# Patient Record
Sex: Female | Born: 1965
Health system: Southern US, Community
[De-identification: ages and names within clinical notes are randomized; demographics above are authoritative.]

## PROBLEM LIST (undated history)

## (undated) DIAGNOSIS — S060X9A Concussion with loss of consciousness of unspecified duration, initial encounter: Secondary | ICD-10-CM

## (undated) DIAGNOSIS — R51 Headache: Secondary | ICD-10-CM

## (undated) DIAGNOSIS — F329 Major depressive disorder, single episode, unspecified: Secondary | ICD-10-CM

## (undated) DIAGNOSIS — F419 Anxiety disorder, unspecified: Secondary | ICD-10-CM

## (undated) DIAGNOSIS — S060XAA Concussion with loss of consciousness status unknown, initial encounter: Secondary | ICD-10-CM

## (undated) DIAGNOSIS — R519 Headache, unspecified: Secondary | ICD-10-CM

## (undated) DIAGNOSIS — A159 Respiratory tuberculosis unspecified: Secondary | ICD-10-CM

## (undated) DIAGNOSIS — F32A Depression, unspecified: Secondary | ICD-10-CM

## (undated) DIAGNOSIS — R011 Cardiac murmur, unspecified: Secondary | ICD-10-CM

## (undated) HISTORY — DX: Major depressive disorder, single episode, unspecified: F32.9

## (undated) HISTORY — DX: Concussion with loss of consciousness status unknown, initial encounter: S06.0XAA

## (undated) HISTORY — DX: Anxiety disorder, unspecified: F41.9

## (undated) HISTORY — DX: Depression, unspecified: F32.A

## (undated) HISTORY — DX: Concussion with loss of consciousness of unspecified duration, initial encounter: S06.0X9A

## (undated) HISTORY — PX: NO PAST SURGERIES: SHX2092

## (undated) HISTORY — DX: Headache: R51

## (undated) HISTORY — DX: Respiratory tuberculosis unspecified: A15.9

## (undated) HISTORY — DX: Headache, unspecified: R51.9

## (undated) HISTORY — DX: Cardiac murmur, unspecified: R01.1

---

## 2001-02-13 ENCOUNTER — Other Ambulatory Visit: Admission: RE | Admit: 2001-02-13 | Discharge: 2001-02-13 | Payer: Self-pay | Admitting: Obstetrics & Gynecology

## 2002-04-10 ENCOUNTER — Encounter: Payer: Self-pay | Admitting: Obstetrics & Gynecology

## 2002-04-10 ENCOUNTER — Encounter: Admission: RE | Admit: 2002-04-10 | Discharge: 2002-04-10 | Payer: Self-pay | Admitting: Obstetrics & Gynecology

## 2002-06-05 ENCOUNTER — Other Ambulatory Visit: Admission: RE | Admit: 2002-06-05 | Discharge: 2002-06-05 | Payer: Self-pay | Admitting: Obstetrics & Gynecology

## 2003-02-05 ENCOUNTER — Encounter (INDEPENDENT_AMBULATORY_CARE_PROVIDER_SITE_OTHER): Payer: Self-pay | Admitting: Interventional Cardiology

## 2003-02-05 ENCOUNTER — Ambulatory Visit (HOSPITAL_COMMUNITY): Admission: RE | Admit: 2003-02-05 | Discharge: 2003-02-05 | Payer: Self-pay | Admitting: Pulmonary Disease

## 2003-04-30 ENCOUNTER — Other Ambulatory Visit: Admission: RE | Admit: 2003-04-30 | Discharge: 2003-04-30 | Payer: Self-pay | Admitting: Obstetrics & Gynecology

## 2004-05-12 ENCOUNTER — Encounter: Admission: RE | Admit: 2004-05-12 | Discharge: 2004-05-12 | Payer: Self-pay | Admitting: Pulmonary Disease

## 2004-08-15 DIAGNOSIS — R011 Cardiac murmur, unspecified: Secondary | ICD-10-CM

## 2004-08-15 HISTORY — DX: Cardiac murmur, unspecified: R01.1

## 2004-12-01 ENCOUNTER — Encounter: Payer: Self-pay | Admitting: Cardiology

## 2004-12-01 ENCOUNTER — Ambulatory Visit (HOSPITAL_COMMUNITY): Admission: RE | Admit: 2004-12-01 | Discharge: 2004-12-01 | Payer: Self-pay | Admitting: Pulmonary Disease

## 2004-12-01 ENCOUNTER — Ambulatory Visit: Payer: Self-pay | Admitting: Cardiology

## 2005-08-10 ENCOUNTER — Encounter: Admission: RE | Admit: 2005-08-10 | Discharge: 2005-08-10 | Payer: Self-pay | Admitting: Pulmonary Disease

## 2006-08-16 ENCOUNTER — Encounter: Admission: RE | Admit: 2006-08-16 | Discharge: 2006-08-16 | Payer: Self-pay | Admitting: Obstetrics & Gynecology

## 2007-08-22 ENCOUNTER — Encounter: Admission: RE | Admit: 2007-08-22 | Discharge: 2007-08-22 | Payer: Self-pay | Admitting: Obstetrics & Gynecology

## 2008-08-27 ENCOUNTER — Encounter: Admission: RE | Admit: 2008-08-27 | Discharge: 2008-08-27 | Payer: Self-pay | Admitting: Obstetrics & Gynecology

## 2009-08-16 ENCOUNTER — Emergency Department (HOSPITAL_COMMUNITY): Admission: EM | Admit: 2009-08-16 | Discharge: 2009-08-16 | Payer: Self-pay | Admitting: Family Medicine

## 2009-09-02 ENCOUNTER — Encounter: Admission: RE | Admit: 2009-09-02 | Discharge: 2009-09-02 | Payer: Self-pay | Admitting: Obstetrics & Gynecology

## 2009-11-19 ENCOUNTER — Encounter: Admission: RE | Admit: 2009-11-19 | Discharge: 2009-11-19 | Payer: Self-pay | Admitting: Internal Medicine

## 2009-11-24 ENCOUNTER — Encounter: Admission: RE | Admit: 2009-11-24 | Discharge: 2009-11-24 | Payer: Self-pay | Admitting: Internal Medicine

## 2009-11-28 ENCOUNTER — Emergency Department (HOSPITAL_COMMUNITY): Admission: EM | Admit: 2009-11-28 | Discharge: 2009-11-28 | Payer: Self-pay | Admitting: Family Medicine

## 2009-12-04 ENCOUNTER — Encounter: Admission: RE | Admit: 2009-12-04 | Discharge: 2009-12-04 | Payer: Self-pay | Admitting: Neurological Surgery

## 2010-09-04 ENCOUNTER — Encounter: Payer: Self-pay | Admitting: Obstetrics & Gynecology

## 2010-09-15 ENCOUNTER — Encounter: Payer: Self-pay | Admitting: Obstetrics & Gynecology

## 2010-09-23 ENCOUNTER — Other Ambulatory Visit: Payer: Self-pay | Admitting: Obstetrics & Gynecology

## 2010-09-23 DIAGNOSIS — Z1231 Encounter for screening mammogram for malignant neoplasm of breast: Secondary | ICD-10-CM

## 2010-10-12 ENCOUNTER — Ambulatory Visit: Payer: Self-pay

## 2011-04-08 ENCOUNTER — Ambulatory Visit
Admission: RE | Admit: 2011-04-08 | Discharge: 2011-04-08 | Disposition: A | Discharge status: Home / Self Care | Payer: 59 | Source: Ambulatory Visit | Attending: Obstetrics & Gynecology | Admitting: Obstetrics & Gynecology

## 2011-04-08 DIAGNOSIS — Z1231 Encounter for screening mammogram for malignant neoplasm of breast: Secondary | ICD-10-CM

## 2011-05-06 ENCOUNTER — Inpatient Hospital Stay (INDEPENDENT_AMBULATORY_CARE_PROVIDER_SITE_OTHER)
Admission: RE | Admit: 2011-05-06 | Discharge: 2011-05-06 | Disposition: A | Discharge status: Home / Self Care | Payer: 59 | Source: Ambulatory Visit | Attending: Emergency Medicine | Admitting: Emergency Medicine

## 2011-05-06 DIAGNOSIS — M79609 Pain in unspecified limb: Secondary | ICD-10-CM

## 2013-09-10 ENCOUNTER — Emergency Department (HOSPITAL_COMMUNITY): Payer: 59

## 2013-09-10 ENCOUNTER — Emergency Department (HOSPITAL_COMMUNITY)
Admission: EM | Admit: 2013-09-10 | Discharge: 2013-09-10 | Disposition: A | Discharge status: Home / Self Care | Payer: 59 | Attending: Emergency Medicine | Admitting: Emergency Medicine

## 2013-09-10 ENCOUNTER — Encounter (HOSPITAL_COMMUNITY): Payer: Self-pay | Admitting: Emergency Medicine

## 2013-09-10 DIAGNOSIS — S3981XA Other specified injuries of abdomen, initial encounter: Secondary | ICD-10-CM | POA: Diagnosis present

## 2013-09-10 DIAGNOSIS — S301XXA Contusion of abdominal wall, initial encounter: Secondary | ICD-10-CM | POA: Insufficient documentation

## 2013-09-10 DIAGNOSIS — Y9389 Activity, other specified: Secondary | ICD-10-CM | POA: Diagnosis not present

## 2013-09-10 DIAGNOSIS — Z79899 Other long term (current) drug therapy: Secondary | ICD-10-CM | POA: Diagnosis not present

## 2013-09-10 DIAGNOSIS — Y92410 Unspecified street and highway as the place of occurrence of the external cause: Secondary | ICD-10-CM | POA: Diagnosis not present

## 2013-09-10 DIAGNOSIS — IMO0002 Reserved for concepts with insufficient information to code with codable children: Secondary | ICD-10-CM | POA: Diagnosis not present

## 2013-09-10 LAB — CBC WITH DIFFERENTIAL/PLATELET
Basophils Absolute: 0 10*3/uL (ref 0.0–0.1)
Basophils Relative: 0 % (ref 0–1)
Eosinophils Absolute: 0 10*3/uL (ref 0.0–0.7)
Eosinophils Relative: 1 % (ref 0–5)
HCT: 42 % (ref 36.0–46.0)
Hemoglobin: 14.4 g/dL (ref 12.0–15.0)
Lymphocytes Relative: 14 % (ref 12–46)
Lymphs Abs: 1.2 10*3/uL (ref 0.7–4.0)
MCH: 31.1 pg (ref 26.0–34.0)
MCHC: 34.3 g/dL (ref 30.0–36.0)
MCV: 90.7 fL (ref 78.0–100.0)
Monocytes Absolute: 0.6 10*3/uL (ref 0.1–1.0)
Monocytes Relative: 7 % (ref 3–12)
Neutro Abs: 6.5 10*3/uL (ref 1.7–7.7)
Neutrophils Relative %: 78 % — ABNORMAL HIGH (ref 43–77)
Platelets: 309 10*3/uL (ref 150–400)
RBC: 4.63 MIL/uL (ref 3.87–5.11)
RDW: 13.6 % (ref 11.5–15.5)
WBC: 8.4 10*3/uL (ref 4.0–10.5)

## 2013-09-10 LAB — BASIC METABOLIC PANEL
BUN: 9 mg/dL (ref 6–23)
CO2: 26 mEq/L (ref 19–32)
Calcium: 9.5 mg/dL (ref 8.4–10.5)
Chloride: 105 mEq/L (ref 96–112)
Creatinine, Ser: 0.86 mg/dL (ref 0.50–1.10)
GFR calc Af Amer: >90 mL/min (ref >90)
GFR calc non Af Amer: 78 mL/min — ABNORMAL LOW (ref >90)
Glucose, Bld: 103 mg/dL — ABNORMAL HIGH (ref 70–99)
Potassium: 3.7 mEq/L (ref 3.7–5.3)
Sodium: 143 mEq/L (ref 137–147)

## 2013-09-10 LAB — ABO/RH: ABO/RH(D): A POS

## 2013-09-10 LAB — TYPE AND SCREEN
ABO/RH(D): A POS
Antibody Screen: NEGATIVE

## 2013-09-10 LAB — CG4 I-STAT (LACTIC ACID): Lactic Acid, Venous: 1.39 mmol/L (ref 0.5–2.2)

## 2013-09-10 MED ORDER — SODIUM CHLORIDE 0.9 % IV BOLUS (SEPSIS)
1000.0000 mL | Freq: Once | INTRAVENOUS | Status: AC
  Administered 2013-09-10: 1000 mL via INTRAVENOUS

## 2013-09-10 MED ORDER — ONDANSETRON HCL 4 MG/2ML IJ SOLN
4.0000 mg | Freq: Once | INTRAMUSCULAR | Status: AC
  Administered 2013-09-10: 4 mg via INTRAVENOUS
  Filled 2013-09-10: qty 2

## 2013-09-10 MED ORDER — IBUPROFEN 600 MG PO TABS
600.0000 mg | ORAL_TABLET | Freq: Four times a day (QID) | ORAL | Status: DC | PRN
Start: 2013-09-10 — End: 2014-06-16

## 2013-09-10 MED ORDER — FENTANYL CITRATE 0.05 MG/ML IJ SOLN
50.0000 ug | INTRAMUSCULAR | Status: DC | PRN
  Administered 2013-09-10: 50 ug via INTRAVENOUS
  Filled 2013-09-10: qty 2

## 2013-09-10 MED ORDER — IBUPROFEN 200 MG PO TABS: 600.0000 mg | ORAL_TABLET | Freq: Once | ORAL | Status: DC

## 2013-09-10 MED ORDER — OXYCODONE-ACETAMINOPHEN 5-325 MG PO TABS
1.0000 | ORAL_TABLET | ORAL | Status: DC | PRN
Start: 2013-09-10 — End: 2014-06-05

## 2013-09-10 MED ORDER — IOHEXOL 300 MG/ML  SOLN
100.0000 mL | Freq: Once | INTRAMUSCULAR | Status: AC | PRN
  Administered 2013-09-10: 100 mL via INTRAVENOUS

## 2013-09-10 NOTE — ED Notes (Signed)
Bed: WA23 Expected date:  Expected time:  Means of arrival:  Comments: 
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
## 2013-09-10 NOTE — ED Notes (Signed)
Patient left prior to getting Advil 600mg 
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
## 2013-09-10 NOTE — ED Notes (Signed)
MD at bedside. 

## 2013-11-27 ENCOUNTER — Other Ambulatory Visit (HOSPITAL_COMMUNITY): Payer: Self-pay | Admitting: Specialist

## 2013-11-27 DIAGNOSIS — M545 Low back pain, unspecified: Secondary | ICD-10-CM

## 2013-11-27 DIAGNOSIS — M542 Cervicalgia: Secondary | ICD-10-CM

## 2013-12-10 ENCOUNTER — Ambulatory Visit (HOSPITAL_COMMUNITY)
Admission: RE | Admit: 2013-12-10 | Discharge: 2013-12-10 | Disposition: A | Discharge status: Home / Self Care | Payer: 59 | Source: Ambulatory Visit | Attending: Specialist | Admitting: Specialist

## 2013-12-10 DIAGNOSIS — M545 Low back pain, unspecified: Secondary | ICD-10-CM | POA: Insufficient documentation

## 2013-12-10 DIAGNOSIS — M542 Cervicalgia: Secondary | ICD-10-CM | POA: Insufficient documentation

## 2014-04-08 ENCOUNTER — Other Ambulatory Visit (HOSPITAL_COMMUNITY): Payer: Self-pay | Admitting: Specialist

## 2014-04-08 DIAGNOSIS — M25552 Pain in left hip: Secondary | ICD-10-CM

## 2014-04-08 DIAGNOSIS — M25562 Pain in left knee: Secondary | ICD-10-CM

## 2014-04-12 ENCOUNTER — Ambulatory Visit (HOSPITAL_BASED_OUTPATIENT_CLINIC_OR_DEPARTMENT_OTHER)
Admission: RE | Admit: 2014-04-12 | Discharge: 2014-04-12 | Disposition: A | Discharge status: Home / Self Care | Payer: 59 | Source: Ambulatory Visit | Attending: Specialist | Admitting: Specialist

## 2014-04-12 DIAGNOSIS — M25552 Pain in left hip: Secondary | ICD-10-CM

## 2014-04-12 DIAGNOSIS — R209 Unspecified disturbances of skin sensation: Secondary | ICD-10-CM | POA: Insufficient documentation

## 2014-04-12 DIAGNOSIS — M25559 Pain in unspecified hip: Secondary | ICD-10-CM | POA: Insufficient documentation

## 2014-04-15 ENCOUNTER — Ambulatory Visit (HOSPITAL_COMMUNITY): Payer: 59

## 2014-04-29 ENCOUNTER — Ambulatory Visit (HOSPITAL_COMMUNITY)
Admission: RE | Admit: 2014-04-29 | Discharge: 2014-04-29 | Disposition: A | Discharge status: Home / Self Care | Payer: 59 | Source: Ambulatory Visit | Attending: Anesthesiology | Admitting: Anesthesiology

## 2014-04-29 ENCOUNTER — Other Ambulatory Visit (HOSPITAL_COMMUNITY): Payer: Self-pay | Admitting: Anesthesiology

## 2014-04-29 DIAGNOSIS — M25512 Pain in left shoulder: Secondary | ICD-10-CM

## 2014-04-29 DIAGNOSIS — L539 Erythematous condition, unspecified: Secondary | ICD-10-CM | POA: Diagnosis not present

## 2014-04-29 DIAGNOSIS — M7989 Other specified soft tissue disorders: Secondary | ICD-10-CM | POA: Diagnosis not present

## 2014-04-29 DIAGNOSIS — M79609 Pain in unspecified limb: Secondary | ICD-10-CM | POA: Diagnosis not present

## 2014-04-29 DIAGNOSIS — M79605 Pain in left leg: Secondary | ICD-10-CM

## 2014-05-14 ENCOUNTER — Other Ambulatory Visit (HOSPITAL_BASED_OUTPATIENT_CLINIC_OR_DEPARTMENT_OTHER): Payer: Self-pay | Admitting: Obstetrics and Gynecology

## 2014-05-14 DIAGNOSIS — D259 Leiomyoma of uterus, unspecified: Secondary | ICD-10-CM

## 2014-05-27 ENCOUNTER — Other Ambulatory Visit (HOSPITAL_BASED_OUTPATIENT_CLINIC_OR_DEPARTMENT_OTHER): Payer: Self-pay | Admitting: Obstetrics and Gynecology

## 2014-05-27 DIAGNOSIS — D259 Leiomyoma of uterus, unspecified: Secondary | ICD-10-CM

## 2014-05-30 ENCOUNTER — Ambulatory Visit (HOSPITAL_COMMUNITY)
Admission: RE | Admit: 2014-05-30 | Discharge: 2014-05-30 | Disposition: A | Discharge status: Home / Self Care | Payer: 59 | Source: Ambulatory Visit | Attending: Obstetrics and Gynecology | Admitting: Obstetrics and Gynecology

## 2014-05-30 ENCOUNTER — Other Ambulatory Visit (HOSPITAL_COMMUNITY): Payer: 59

## 2014-05-30 DIAGNOSIS — D259 Leiomyoma of uterus, unspecified: Secondary | ICD-10-CM | POA: Insufficient documentation

## 2014-06-05 ENCOUNTER — Ambulatory Visit (INDEPENDENT_AMBULATORY_CARE_PROVIDER_SITE_OTHER): Payer: 59 | Admitting: Neurology

## 2014-06-05 ENCOUNTER — Encounter: Payer: Self-pay | Admitting: Neurology

## 2014-06-05 VITALS — BP 161/79 | HR 76 | Temp 98.6°F | Ht 67.5 in | Wt 151.0 lb

## 2014-06-05 DIAGNOSIS — M7989 Other specified soft tissue disorders: Secondary | ICD-10-CM | POA: Insufficient documentation

## 2014-06-05 DIAGNOSIS — R531 Weakness: Secondary | ICD-10-CM

## 2014-06-05 DIAGNOSIS — F0781 Postconcussional syndrome: Secondary | ICD-10-CM | POA: Insufficient documentation

## 2014-06-05 DIAGNOSIS — M5010 Cervical disc disorder with radiculopathy, unspecified cervical region: Secondary | ICD-10-CM | POA: Insufficient documentation

## 2014-06-05 DIAGNOSIS — IMO0002 Reserved for concepts with insufficient information to code with codable children: Secondary | ICD-10-CM | POA: Insufficient documentation

## 2014-06-05 DIAGNOSIS — G9050 Complex regional pain syndrome I, unspecified: Secondary | ICD-10-CM

## 2014-06-05 DIAGNOSIS — M6289 Other specified disorders of muscle: Secondary | ICD-10-CM

## 2014-06-05 DIAGNOSIS — R202 Paresthesia of skin: Secondary | ICD-10-CM

## 2014-06-05 MED ORDER — PREGABALIN 75 MG PO CAPS: 75.0000 mg | ORAL_CAPSULE | Freq: Two times a day (BID) | ORAL | Status: DC

## 2014-06-05 NOTE — Patient Instructions (Signed)
Overall you are doing fairly well but I do want to suggest a few things today:   Remember to drink plenty of fluid, eat healthy meals and do not skip any meals. Try to eat protein with a every meal and eat a healthy snack such as fruit or nuts in between meals. Try to keep a regular sleep-wake schedule and try to exercise daily, particularly in the form of walking, 20-30 minutes a day, if you can.   As far as your medications are concerned, I would like to suggest: Lyrica 75mg  twice daily for a week Lyrica 150mg  twice daily for a wekk Then call to discuss dosing  As far as diagnostic testing: CT of the abdomen with contrast, lab test  I would like to see you back in 3 months, sooner if we need to. Please call us with any interim questions, concerns, problems, updates or refill requests.   Please also call us for any test results so we can go over those with you on the phone.  My clinical assistant and will answer any of your questions and relay your messages to me and also relay most of my messages to you.   Our phone number is [phone]. We also have an after hours call service for urgent matters and there is a physician on-call for urgent questions. For any emergencies you know to call 911 or go to the nearest emergency room
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
## 2014-06-05 NOTE — Addendum Note (Signed)
Addended by: Sarina Ill B on: 06/05/2014 11:00 PM   Modules accepted: Orders

## 2014-06-06 LAB — BASIC METABOLIC PANEL
BUN / CREAT RATIO: 10 (ref 9–23)
BUN: 8 mg/dL (ref 6–24)
CO2: 27 mmol/L (ref 18–29)
CREATININE: 0.77 mg/dL (ref 0.57–1.00)
Calcium: 10.1 mg/dL (ref 8.7–10.2)
Chloride: 101 mmol/L (ref 96–108)
GFR calc non Af Amer: 90 mL/min/{1.73_m2} (ref >59)
GFR, EST AFRICAN AMERICAN: 104 mL/min/{1.73_m2} (ref >59)
Glucose: 87 mg/dL (ref 65–99)
Potassium: 4.5 mmol/L (ref 3.5–5.2)
Sodium: 137 mmol/L (ref 134–144)

## 2014-06-09 ENCOUNTER — Telehealth: Payer: Self-pay | Admitting: Neurology

## 2014-06-09 NOTE — Telephone Encounter (Signed)
Patient requesting an order for MRI and CT scan at [facility].  Patient is aware of MRI on 11/1 at [facility], due to transportation it would be easier for her to go to [facility].  Please call anytime and may leave detailed message on voicemail.

## 2014-06-10 NOTE — Telephone Encounter (Signed)
Thank you. Will relay to [facility].

## 2014-06-11 NOTE — Telephone Encounter (Signed)
Message copied by Milta Deiters on Wed Jun 11, 2014 11:49 AM ------      Message from: Amber Combs      Created: Fri Jun 06, 2014  2:32 PM       Please let patient know that her metabolic panel lab is normal. Thank you. ------

## 2014-06-11 NOTE — Telephone Encounter (Signed)
Called patient. Gave lab results

## 2014-06-12 ENCOUNTER — Encounter: Payer: Self-pay | Admitting: *Deleted

## 2014-06-15 ENCOUNTER — Other Ambulatory Visit: Payer: 59

## 2014-06-16 ENCOUNTER — Encounter (HOSPITAL_COMMUNITY): Payer: Self-pay | Admitting: *Deleted

## 2014-06-16 ENCOUNTER — Encounter (HOSPITAL_COMMUNITY): Payer: Self-pay

## 2014-06-16 ENCOUNTER — Emergency Department (HOSPITAL_COMMUNITY)
Admission: EM | Admit: 2014-06-16 | Discharge: 2014-06-17 | Disposition: A | Discharge status: Home / Self Care | Payer: 59 | Attending: Emergency Medicine | Admitting: Emergency Medicine

## 2014-06-16 ENCOUNTER — Ambulatory Visit (HOSPITAL_COMMUNITY)
Admission: RE | Admit: 2014-06-16 | Discharge: 2014-06-16 | Disposition: A | Discharge status: Home / Self Care | Payer: 59 | Source: Ambulatory Visit | Attending: Neurology | Admitting: Neurology

## 2014-06-16 ENCOUNTER — Telehealth: Payer: Self-pay | Admitting: Neurology

## 2014-06-16 DIAGNOSIS — D259 Leiomyoma of uterus, unspecified: Secondary | ICD-10-CM | POA: Insufficient documentation

## 2014-06-16 DIAGNOSIS — R200 Anesthesia of skin: Secondary | ICD-10-CM | POA: Diagnosis not present

## 2014-06-16 DIAGNOSIS — R51 Headache: Secondary | ICD-10-CM | POA: Diagnosis present

## 2014-06-16 DIAGNOSIS — Z8611 Personal history of tuberculosis: Secondary | ICD-10-CM | POA: Insufficient documentation

## 2014-06-16 DIAGNOSIS — H0520 Unspecified exophthalmos: Secondary | ICD-10-CM | POA: Diagnosis not present

## 2014-06-16 DIAGNOSIS — M7989 Other specified soft tissue disorders: Secondary | ICD-10-CM | POA: Diagnosis not present

## 2014-06-16 DIAGNOSIS — R202 Paresthesia of skin: Secondary | ICD-10-CM

## 2014-06-16 DIAGNOSIS — I82402 Acute embolism and thrombosis of unspecified deep veins of left lower extremity: Secondary | ICD-10-CM

## 2014-06-16 DIAGNOSIS — R531 Weakness: Secondary | ICD-10-CM | POA: Diagnosis not present

## 2014-06-16 DIAGNOSIS — Z79899 Other long term (current) drug therapy: Secondary | ICD-10-CM | POA: Insufficient documentation

## 2014-06-16 DIAGNOSIS — I82409 Acute embolism and thrombosis of unspecified deep veins of unspecified lower extremity: Secondary | ICD-10-CM | POA: Diagnosis not present

## 2014-06-16 DIAGNOSIS — Z87828 Personal history of other (healed) physical injury and trauma: Secondary | ICD-10-CM | POA: Diagnosis not present

## 2014-06-16 MED ORDER — RIVAROXABAN 15 MG PO TABS
ORAL_TABLET | ORAL | Status: AC
  Filled 2014-06-16: qty 1

## 2014-06-16 MED ORDER — RIVAROXABAN (XARELTO) VTE STARTER PACK (15 & 20 MG): ORAL_TABLET | ORAL | Status: DC

## 2014-06-16 MED ORDER — IOHEXOL 300 MG/ML  SOLN
100.0000 mL | Freq: Once | INTRAMUSCULAR | Status: AC | PRN
  Administered 2014-06-16: 40 mL via INTRAVENOUS

## 2014-06-16 MED ORDER — GADOBENATE DIMEGLUMINE 529 MG/ML IV SOLN
15.0000 mL | Freq: Once | INTRAVENOUS | Status: AC | PRN
  Administered 2014-06-16: 15 mL via INTRAVENOUS

## 2014-06-16 MED ORDER — RIVAROXABAN 15 MG PO TABS
15.0000 mg | ORAL_TABLET | Freq: Once | ORAL | Status: AC
  Administered 2014-06-16: 15 mg via ORAL
  Filled 2014-06-16: qty 1

## 2014-06-16 NOTE — Discharge Instructions (Signed)
Stop taking ibuprofen. Do not take any NSAID while you are taking Xarelto.   Deep Vein Thrombosis A deep vein thrombosis (DVT) is a blood clot that develops in the deep, larger veins of the leg, arm, or pelvis. These are more dangerous than clots that might form in veins near the surface of the body. A DVT can lead to serious and even life-threatening complications if the clot breaks off and travels in the bloodstream to the lungs.  A DVT can damage the valves in your leg veins so that instead of flowing upward, the blood pools in the lower leg. This is called post-thrombotic syndrome, and it can result in pain, swelling, discoloration, and sores on the leg. CAUSES Usually, several things contribute to the formation of blood clots. Contributing factors include:  The flow of blood slows down.  The inside of the vein is damaged in some way.  You have a condition that makes blood clot more easily. RISK FACTORS Some people are more likely than others to develop blood clots. Risk factors include:   Smoking.  Being overweight (obese).  Sitting or lying still for a long time. This includes long-distance travel, paralysis, or recovery from an illness or surgery. Other factors that increase risk are:   Older age, especially over 62 years of age.  Having a family history of blood clots or if you have already had a blot clot.  Having major or lengthy surgery. This is especially true for surgery on the hip, knee, or belly (abdomen). Hip surgery is particularly high risk.  Having a long, thin tube (catheter) placed inside a vein during a medical procedure.  Breaking a hip or leg.  Having cancer or cancer treatment.  Pregnancy and childbirth.  Hormone changes make the blood clot more easily during pregnancy.  The fetus puts pressure on the veins of the pelvis.  There is a risk of injury to veins during delivery or a caesarean delivery. The risk is highest just after  childbirth.  Medicines containing the female hormone estrogen. This includes birth control pills and hormone replacement therapy.  Other circulation or heart problems.  SIGNS AND SYMPTOMS When a clot forms, it can either partially or totally block the blood flow in that vein. Symptoms of a DVT can include:  Swelling of the leg or arm, especially if one side is much worse.  Warmth and redness of the leg or arm, especially if one side is much worse.  Pain in an arm or leg. If the clot is in the leg, symptoms may be more noticeable or worse when standing or walking. The symptoms of a DVT that has traveled to the lungs (pulmonary embolism, PE) usually start suddenly and include:  Shortness of breath.  Coughing.  Coughing up blood or blood-tinged mucus.  Chest pain. The chest pain is often worse with deep breaths.  Rapid heartbeat. Anyone with these symptoms should get emergency medical treatment right away. Do not wait to see if the symptoms will go away. Call your local emergency services (911 in the [place] if you have these symptoms. Do not drive yourself to the hospital. DIAGNOSIS If a DVT is suspected, your health care provider will take a full medical history and perform a physical exam. Tests that also may be required include:  Blood tests, including studies of the clotting properties of the blood.  Ultrasound to see if you have clots in your legs or lungs.  X-rays to show the flow of blood when dye  is injected into the veins (venogram).  Studies of your lungs if you have any chest symptoms. PREVENTION  Exercise the legs regularly. Take a brisk 30-minute walk every day.  Maintain a weight that is appropriate for your height.  Avoid sitting or lying in bed for long periods of time without moving your legs.  Women, particularly those over the age of 20 years, should consider the risks and benefits of taking estrogen medicines, including birth control pills.  Do not smoke,  especially if you take estrogen medicines.  Long-distance travel can increase your risk of DVT. You should exercise your legs by walking or pumping the muscles every hour.  Many of the risk factors above relate to situations that exist with hospitalization, either for illness, injury, or elective surgery. Prevention may include medical and nonmedical measures.  Your health care provider will assess you for the need for venous thromboembolism prevention when you are admitted to the hospital. If you are having surgery, your surgeon will assess you the day of or day after surgery. TREATMENT Once identified, a DVT can be treated. It can also be prevented in some circumstances. Once you have had a DVT, you may be at increased risk for a DVT in the future. The most common treatment for DVT is blood-thinning (anticoagulant) medicine, which reduces the blood's tendency to clot. Anticoagulants can stop new blood clots from forming and stop old clots from growing. They cannot dissolve existing clots. Your body does this by itself over time. Anticoagulants can be given by mouth, through an IV tube, or by injection. Your health care provider will determine the best program for you. Other medicines or treatments that may be used are:  Heparin or related medicines (low molecular weight heparin) are often the first treatment for a blood clot. They act quickly. However, they cannot be taken orally and must be given either in shot form or by IV tube.  Heparin can cause a fall in a component of blood that stops bleeding and forms blood clots (platelets). You will be monitored with blood tests to be sure this does not occur.  Warfarin is an anticoagulant that can be swallowed. It takes a few days to start working, so usually heparin or related medicines are used in combination. Once warfarin is working, heparin is usually stopped.  Factor Xa inhibitor medicines, such as rivaroxaban and apixaban, also reduce blood  clotting. These medicines are taken orally and can often be used without heparin or related medicines.  Less commonly, clot dissolving drugs (thrombolytics) are used to dissolve a DVT. They carry a high risk of bleeding, so they are used mainly in severe cases where your life or a part of your body is threatened.  Very rarely, a blood clot in the leg needs to be removed surgically.  If you are unable to take anticoagulants, your health care provider may arrange for you to have a filter placed in a main vein in your abdomen. This filter prevents clots from traveling to your lungs. HOME CARE INSTRUCTIONS  Take all medicines as directed by your health care provider.  Learn as much as you can about DVT.  Wear a medical alert bracelet or carry a medical alert card.  Ask your health care provider how soon you can go back to normal activities. It is important to stay active to prevent blood clots. If you are on anticoagulant medicine, avoid contact sports.  It is very important to exercise. This is especially important while traveling,  sitting, or standing for long periods of time. Exercise your legs by walking or by tightening and relaxing your leg muscles regularly. Take frequent walks.  You may need to wear compression stockings. These are tight elastic stockings that apply pressure to the lower legs. This pressure can help keep the blood in the legs from clotting. Taking Warfarin Warfarin is a daily medicine that is taken by mouth. Your health care provider will advise you on the length of treatment (usually 3-6 months, sometimes lifelong). If you take warfarin:  Understand how to take warfarin and foods that can affect how warfarin works in Veterinary surgeon.  Too much and too little warfarin are both dangerous. Too much warfarin increases the risk of bleeding. Too little warfarin continues to allow the risk for blood clots. Warfarin and Regular Blood Testing While taking warfarin, you will need to  have regular blood tests to measure your blood clotting time. These blood tests usually include both the prothrombin time (PT) and international normalized ratio (INR) tests. The PT and INR results allow your health care provider to adjust your dose of warfarin. It is very important that you have your PT and INR tested as often as directed by your health care provider.  Warfarin and Your Diet Avoid major changes in your diet, or notify your health care provider before changing your diet. Arrange a visit with a registered dietitian to answer your questions. Many foods, especially foods high in vitamin K, can interfere with warfarin and affect the PT and INR results. You should eat a consistent amount of foods high in vitamin K. Foods high in vitamin K include:   Spinach, kale, broccoli, cabbage, collard and turnip greens, Brussels sprouts, peas, cauliflower, seaweed, and parsley.  Beef and pork liver.  Green tea.  Soybean oil. Warfarin with Other Medicines Many medicines can interfere with warfarin and affect the PT and INR results. You must:  Tell your health care provider about any and all medicines, vitamins, and supplements you take, including aspirin and other over-the-counter anti-inflammatory medicines. Be especially cautious with aspirin and anti-inflammatory medicines. Ask your health care provider before taking these.  Do not take or discontinue any prescribed or over-the-counter medicine except on the advice of your health care provider or pharmacist. Warfarin Side Effects Warfarin can have side effects, such as easy bruising and difficulty stopping bleeding. Ask your health care provider or pharmacist about other side effects of warfarin. You will need to:  Hold pressure over cuts for longer than usual.  Notify your dentist and other health care providers that you are taking warfarin before you undergo any procedures where bleeding may occur. Warfarin with Alcohol and Tobacco    Drinking alcohol frequently can increase the effect of warfarin, leading to excess bleeding. It is best to avoid alcoholic drinks or to consume only very small amounts while taking warfarin. Notify your health care provider if you change your alcohol intake.   Do not use any tobacco products including cigarettes, chewing tobacco, or electronic cigarettes. If you smoke, quit. Ask your health care provider for help with quitting smoking. Alternative Medicines to Warfarin: Factor Xa Inhibitor Medicines  These blood-thinning medicines are taken by mouth, usually for several weeks or longer. It is important to take the medicine every single day at the same time each day.  There are no regular blood tests required when using these medicines.  There are fewer food and drug interactions than with warfarin.  The side effects of this class  of medicine are similar to those of warfarin, including excessive bruising or bleeding. Ask your health care provider or pharmacist about other potential side effects. SEEK MEDICAL CARE IF:  You notice a rapid heartbeat.  You feel weaker or more tired than usual.  You feel faint.  You notice increased bruising.  You feel your symptoms are not getting better in the time expected.  You believe you are having side effects of medicine. SEEK IMMEDIATE MEDICAL CARE IF:  You have chest pain.  You have trouble breathing.  You have new or increased swelling or pain in one leg.  You cough up blood.  You notice blood in vomit, in a bowel movement, or in urine. MAKE SURE YOU:  Understand these instructions.  Will watch your condition.  Will get help right away if you are not doing well or get worse. Document Released: 08/01/2005 Document Revised: 12/16/2013 Document Reviewed: 04/08/2013 [facility] Patient Information 2015 [place], [place]. This information is not intended to replace advice given to you by your health care provider. Make sure you discuss any  questions you have with your health care provider.  Rivaroxaban oral tablets What is this medicine? RIVAROXABAN (ri va ROX a ban) is an anticoagulant (blood thinner). It is used to treat blood clots in the lungs or in the veins. It is also used after knee or hip surgeries to prevent blood clots. It is also used to lower the chance of stroke in people with a medical condition called atrial fibrillation. This medicine may be used for other purposes; ask your health care provider or pharmacist if you have questions. COMMON BRAND NAME(S): Xarelto, Xarelto Starter Pack What should I tell my health care provider before I take this medicine? They need to know if you have any of these conditions: -bleeding disorders -bleeding in the brain -blood in your stools (black or tarry stools) or if you have blood in your vomit -history of stomach bleeding -kidney disease -liver disease -low blood counts, like low white cell, platelet, or red cell counts -recent or planned spinal or epidural procedure -take medicines that treat or prevent blood clots -an unusual or allergic reaction to rivaroxaban, other medicines, foods, dyes, or preservatives -pregnant or trying to get pregnant -breast-feeding How should I use this medicine? Take this medicine by mouth with a glass of water. Follow the directions on the prescription label. Take your medicine at regular intervals. Do not take it more often than directed. Do not stop taking except on your doctor's advice. Stopping this medicine may increase your risk of a blot clot. Be sure to refill your prescription before you run out of medicine. If you are taking this medicine after hip or knee replacement surgery, take it with or without food. If you are taking this medicine for atrial fibrillation, take it with your evening meal. If you are taking this medicine to treat blood clots, take it with food at the same time each day. If you are unable to swallow your tablet, you  may crush the tablet and mix it in applesauce. Then, immediately eat the applesauce. You should eat more food right after you eat the applesauce containing the crushed tablet. Talk to your pediatrician regarding the use of this medicine in children. Special care may be needed. Overdosage: If you think you have taken too much of this medicine contact a poison control center or emergency room at once. NOTE: This medicine is only for you. Do not share this medicine with others. What  if I miss a dose? If you take your medicine once a day and miss a dose, take the missed dose as soon as you remember. If you take your medicine twice a day and miss a dose, take the missed dose immediately. In this instance, 2 tablets may be taken at the same time. The next day you should take 1 tablet twice a day as directed. What may interact with this medicine? -aspirin and aspirin-like medicines -certain antibiotics like erythromycin, azithromycin, and clarithromycin -certain medicines for fungal infections like ketoconazole and itraconazole -certain medicines for irregular heart beat like amiodarone, quinidine, dronedarone -certain medicines for seizures like carbamazepine, phenytoin -certain medicines that treat or prevent blood clots like warfarin, enoxaparin, and dalteparin -conivaptan -diltiazem -felodipine -indinavir -lopinavir; ritonavir -NSAIDS, medicines for pain and inflammation, like ibuprofen or naproxen -ranolazine -rifampin -ritonavir -St. John's wort -verapamil This list may not describe all possible interactions. Give your health care provider a list of all the medicines, herbs, non-prescription drugs, or dietary supplements you use. Also tell them if you smoke, drink alcohol, or use illegal drugs. Some items may interact with your medicine. What should I watch for while using this medicine? Visit your doctor or health care professional for regular checks on your progress. Your condition will be  monitored carefully while you are receiving this medicine. Notify your doctor or health care professional and seek emergency treatment if you develop breathing problems; changes in vision; chest pain; severe, sudden headache; pain, swelling, warmth in the leg; trouble speaking; sudden numbness or weakness of the face, arm, or leg. These can be signs that your condition has gotten worse. If you are going to have surgery, tell your doctor or health care professional that you are taking this medicine. Tell your health care professional that you use this medicine before you have a spinal or epidural procedure. Sometimes people who take this medicine have bleeding problems around the spine when they have a spinal or epidural procedure. This bleeding is very rare. If you have a spinal or epidural procedure while on this medicine, call your health care professional immediately if you have back pain, numbness or tingling (especially in your legs and feet), muscle weakness, paralysis, or loss of bladder or bowel control. Avoid sports and activities that might cause injury while you are using this medicine. Severe falls or injuries can cause unseen bleeding. Be careful when using sharp tools or knives. Consider using an Copy. Take special care brushing or flossing your teeth. Report any injuries, bruising, or red spots on the skin to your doctor or health care professional. What side effects may I notice from receiving this medicine? Side effects that you should report to your doctor or health care professional as soon as possible: -allergic reactions like skin rash, itching or hives, swelling of the face, lips, or tongue -back pain -redness, blistering, peeling or loosening of the skin, including inside the mouth -signs and symptoms of bleeding such as bloody or black, tarry stools; red or dark-brown urine; spitting up blood or brown material that looks like coffee grounds; red spots on the skin; unusual  bruising or bleeding from the eye, gums, or nose Side effects that usually do not require medical attention (Report these to your doctor or health care professional if they continue or are bothersome.): -dizziness -muscle pain This list may not describe all possible side effects. Call your doctor for medical advice about side effects. You may report side effects to FDA at 1-800-FDA-1088. Where should  I keep my medicine? Keep out of the reach of children. Store at room temperature between 15 and 30 degrees C (59 and 86 degrees F). Throw away any unused medicine after the expiration date. NOTE: This sheet is a summary. It may not cover all possible information. If you have questions about this medicine, talk to your doctor, pharmacist, or health care provider.  2015, [employer]. (2013-11-21 18:47:48)

## 2014-06-16 NOTE — ED Notes (Signed)
Pt complains of pain to left lower extremity.  Rates pain 2 out of 10.
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
## 2014-06-16 NOTE — ED Notes (Addendum)
Pt sent by neurologist for tx of blood clot in lt leg,  Had ct done here today and advised by Her md to come to ER

## 2014-06-17 ENCOUNTER — Ambulatory Visit (HOSPITAL_COMMUNITY)
Admit: 2014-06-17 | Discharge: 2014-06-17 | Disposition: A | Discharge status: Home / Self Care | Payer: 59 | Source: Ambulatory Visit | Attending: Emergency Medicine | Admitting: Emergency Medicine

## 2014-06-17 ENCOUNTER — Other Ambulatory Visit (HOSPITAL_COMMUNITY): Payer: Self-pay | Admitting: Emergency Medicine

## 2014-06-17 DIAGNOSIS — M79605 Pain in left leg: Secondary | ICD-10-CM | POA: Diagnosis present

## 2014-06-17 NOTE — ED Provider Notes (Signed)
Patient noted to have DVT on venous Doppler study of left leg. Patient has already started [facility] She is encouraged to follow up with Dr.Polite  Orlie Dakin, MD 06/17/14 1644
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
## 2014-06-24 ENCOUNTER — Ambulatory Visit (HOSPITAL_COMMUNITY)
Admission: RE | Admit: 2014-06-24 | Discharge: 2014-06-24 | Disposition: A | Discharge status: Home / Self Care | Payer: 59 | Source: Ambulatory Visit | Attending: Internal Medicine | Admitting: Internal Medicine

## 2014-06-24 ENCOUNTER — Other Ambulatory Visit (HOSPITAL_COMMUNITY): Payer: Self-pay | Admitting: Internal Medicine

## 2014-06-24 DIAGNOSIS — O2230 Deep phlebothrombosis in pregnancy, unspecified trimester: Secondary | ICD-10-CM

## 2014-06-24 DIAGNOSIS — I82532 Chronic embolism and thrombosis of left popliteal vein: Secondary | ICD-10-CM | POA: Insufficient documentation

## 2014-11-17 ENCOUNTER — Ambulatory Visit (HOSPITAL_COMMUNITY): Payer: 59 | Attending: Anesthesiology | Admitting: Physical Therapy

## 2014-11-17 DIAGNOSIS — R531 Weakness: Secondary | ICD-10-CM | POA: Diagnosis not present

## 2014-11-17 DIAGNOSIS — M5416 Radiculopathy, lumbar region: Secondary | ICD-10-CM

## 2014-11-17 DIAGNOSIS — Z7409 Other reduced mobility: Secondary | ICD-10-CM

## 2014-11-17 DIAGNOSIS — M545 Low back pain: Secondary | ICD-10-CM | POA: Diagnosis present

## 2014-11-17 DIAGNOSIS — R29898 Other symptoms and signs involving the musculoskeletal system: Secondary | ICD-10-CM

## 2014-11-17 DIAGNOSIS — M5386 Other specified dorsopathies, lumbar region: Secondary | ICD-10-CM | POA: Insufficient documentation

## 2014-11-17 DIAGNOSIS — R262 Difficulty in walking, not elsewhere classified: Secondary | ICD-10-CM | POA: Diagnosis not present

## 2014-11-17 DIAGNOSIS — G8929 Other chronic pain: Secondary | ICD-10-CM

## 2014-11-17 DIAGNOSIS — M2560 Stiffness of unspecified joint, not elsewhere classified: Secondary | ICD-10-CM

## 2014-11-17 NOTE — Patient Instructions (Addendum)
Heel Raise (Sitting)   Raise heels, keeping toes on floor. Repeat _15___ times per set. Do ___1_ sets per session. Do __4__ sessions per day.  http://orth.exer.us/44   Copyright  VHI. All rights reserved.  Knee Extension (Sitting)   Place _0___ pound weight on left ankle and straighten knee fully, lower slowly. Repeat _10-15___ times per set. Do 1____ sets per session. Do __3-4__ sessions per day.  http://orth.exer.us/732   Copyright  VHI. All rights reserved.  Strengthening: Hip Adduction - Isometric   With ball or folded pillow between knees, squeeze knees together. Hold _5___ seconds. Repeat _10-15___ times per set. Do ___1_ sets per session. Do ___4_ sessions per day. Combine with glut set below http://orth.exer.us/612   Copyright  VHI. All rights reserved.  Isometric Gluteals   Tighten buttock muscles. Repeat __10__ times per set. Do __1__ sets per session. Do __4__ sessions per day.  http://orth.exer.us/1126   Copyright  VHI. All rights reserved.  Isometric Abdominal   Lying on back with knees bent, tighten stomach by pressing elbows down. Hold _5___ seconds. Repeat ___10_ times per set. Do ___1_ sets per session. Do ____3-4 sessions per day.  http://orth.exer.us/1086   Copyright  VHI. All rights reserved.  Knee-to-Chest Stretch: Unilateral   With hand behind right knee, pull knee in to chest until a comfortable stretch is felt in lower back and buttocks. Keep back relaxed. Hold _30___ seconds. Repeat ___3-5_ times per set. Do __1__ sets per session. Do _3___ sessions per day.  http://orth.exer.us/126   Copyright  VHI. All rights reserved.  Bridging   Slowly raise buttocks from floor, keeping stomach tight. Repeat _10-15___ times per set. Do _1___ sets per session. Do _3-4___ sessions per day.  http://orth.exer.us/1096   Copyright  VHI. All rights reserved.  Functional Quadriceps: Sit to Stand   Sit on edge of chair, feet flat on floor.  Stand upright, extending knees fully. Repeat _10___ times per set. Do _1___ sets per session. Do __3__ sessions per day.  http://orth.exer.us/734   Copyright  VHI. All rights reserved.
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
## 2014-11-19 ENCOUNTER — Ambulatory Visit (HOSPITAL_COMMUNITY): Payer: 59 | Admitting: Physical Therapy

## 2014-11-19 DIAGNOSIS — M545 Low back pain: Secondary | ICD-10-CM | POA: Diagnosis not present

## 2014-11-19 DIAGNOSIS — Z7409 Other reduced mobility: Secondary | ICD-10-CM

## 2014-11-19 DIAGNOSIS — R262 Difficulty in walking, not elsewhere classified: Secondary | ICD-10-CM

## 2014-11-19 DIAGNOSIS — M2560 Stiffness of unspecified joint, not elsewhere classified: Secondary | ICD-10-CM

## 2014-11-19 DIAGNOSIS — R29898 Other symptoms and signs involving the musculoskeletal system: Secondary | ICD-10-CM

## 2014-11-19 DIAGNOSIS — G8929 Other chronic pain: Secondary | ICD-10-CM

## 2014-11-19 DIAGNOSIS — M5416 Radiculopathy, lumbar region: Principal | ICD-10-CM
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
## 2014-11-27 ENCOUNTER — Ambulatory Visit (HOSPITAL_COMMUNITY): Payer: 59

## 2014-11-27 DIAGNOSIS — G8929 Other chronic pain: Secondary | ICD-10-CM

## 2014-11-27 DIAGNOSIS — M5416 Radiculopathy, lumbar region: Principal | ICD-10-CM

## 2014-11-27 DIAGNOSIS — M2560 Stiffness of unspecified joint, not elsewhere classified: Secondary | ICD-10-CM

## 2014-11-27 DIAGNOSIS — M545 Low back pain: Secondary | ICD-10-CM | POA: Diagnosis not present

## 2014-11-27 DIAGNOSIS — R29898 Other symptoms and signs involving the musculoskeletal system: Secondary | ICD-10-CM

## 2014-11-27 DIAGNOSIS — Z7409 Other reduced mobility: Secondary | ICD-10-CM

## 2014-11-27 DIAGNOSIS — R262 Difficulty in walking, not elsewhere classified: Secondary | ICD-10-CM
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
## 2014-11-27 NOTE — Patient Instructions (Signed)
3D Hip Excursions

## 2014-12-01 ENCOUNTER — Ambulatory Visit (HOSPITAL_COMMUNITY): Payer: 59 | Admitting: Physical Therapy

## 2014-12-01 DIAGNOSIS — Z7409 Other reduced mobility: Secondary | ICD-10-CM

## 2014-12-01 DIAGNOSIS — R262 Difficulty in walking, not elsewhere classified: Secondary | ICD-10-CM

## 2014-12-01 DIAGNOSIS — G8929 Other chronic pain: Secondary | ICD-10-CM

## 2014-12-01 DIAGNOSIS — M545 Low back pain: Secondary | ICD-10-CM | POA: Diagnosis not present

## 2014-12-01 DIAGNOSIS — M5416 Radiculopathy, lumbar region: Principal | ICD-10-CM

## 2014-12-01 DIAGNOSIS — R29898 Other symptoms and signs involving the musculoskeletal system: Secondary | ICD-10-CM

## 2014-12-01 DIAGNOSIS — M2560 Stiffness of unspecified joint, not elsewhere classified: Secondary | ICD-10-CM
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
## 2014-12-01 NOTE — Patient Instructions (Signed)
On Elbows (Prone)   Rise up on elbows as high as possible, keeping hips on floor. Hold _60___ seconds. Repeat ___2_ times per set. Do 1____ sets per session. Do __2__ sessions per day.  http://orth.exer.us/92   Copyright  VHI. All rights reserved.  Press-Up   Press upper body upward, keeping hips in contact with floor. Keep lower back and buttocks relaxed. Hold _10___ seconds. Repeat __10__ times per set. Do _1___ sets per session. Do _2__ sessions per day.  http://orth.exer.us/94   Copyright  VHI. All rights reserved.  Hip Extension (Prone)   Lift left leg _2___ inches from floor, keeping knee locked. Repeat __10__ times per set. Do ___1_ sets per session. Do _2___ sessions per day.  http://orth.exer.us/98   Copyright  VHI. All rights reserved.  Angry Cat Stretch   Tuck chin and tighten stomach, arching back. Repeat _5-10___ times per set. Do ____1 sets per session. Do ____ sessions per day. 1 http://orth.exer.us/118   Copyright  VHI. All rights reserved.  Arm / Leg Extension: Alternate (All-Fours)   Raise right arm and opposite leg. Do not arch neck. Repeat 5-10____ times per set. Do _1___ sets per session. Do _1___ sessions per day.  http://orth.exer.us/110   Copyright  VHI. All rights reserved.

## 2014-12-05 ENCOUNTER — Ambulatory Visit (HOSPITAL_COMMUNITY): Payer: 59 | Admitting: Physical Therapy

## 2014-12-05 DIAGNOSIS — R29898 Other symptoms and signs involving the musculoskeletal system: Secondary | ICD-10-CM

## 2014-12-05 DIAGNOSIS — Z7409 Other reduced mobility: Secondary | ICD-10-CM

## 2014-12-05 DIAGNOSIS — R262 Difficulty in walking, not elsewhere classified: Secondary | ICD-10-CM

## 2014-12-05 DIAGNOSIS — G8929 Other chronic pain: Secondary | ICD-10-CM

## 2014-12-05 DIAGNOSIS — M2560 Stiffness of unspecified joint, not elsewhere classified: Secondary | ICD-10-CM

## 2014-12-05 DIAGNOSIS — M545 Low back pain: Secondary | ICD-10-CM | POA: Diagnosis not present

## 2014-12-05 DIAGNOSIS — M5416 Radiculopathy, lumbar region: Principal | ICD-10-CM
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
## 2014-12-08 ENCOUNTER — Ambulatory Visit (HOSPITAL_COMMUNITY): Payer: 59 | Admitting: Physical Therapy

## 2014-12-08 DIAGNOSIS — R29898 Other symptoms and signs involving the musculoskeletal system: Secondary | ICD-10-CM

## 2014-12-08 DIAGNOSIS — M2560 Stiffness of unspecified joint, not elsewhere classified: Secondary | ICD-10-CM

## 2014-12-08 DIAGNOSIS — M545 Low back pain: Secondary | ICD-10-CM | POA: Diagnosis not present

## 2014-12-08 DIAGNOSIS — G8929 Other chronic pain: Secondary | ICD-10-CM

## 2014-12-08 DIAGNOSIS — M5416 Radiculopathy, lumbar region: Principal | ICD-10-CM

## 2014-12-08 DIAGNOSIS — Z7409 Other reduced mobility: Secondary | ICD-10-CM

## 2014-12-08 DIAGNOSIS — R262 Difficulty in walking, not elsewhere classified: Secondary | ICD-10-CM
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
## 2014-12-11 ENCOUNTER — Ambulatory Visit (HOSPITAL_COMMUNITY): Payer: 59 | Admitting: Physical Therapy

## 2014-12-11 DIAGNOSIS — Z7409 Other reduced mobility: Secondary | ICD-10-CM

## 2014-12-11 DIAGNOSIS — R262 Difficulty in walking, not elsewhere classified: Secondary | ICD-10-CM

## 2014-12-11 DIAGNOSIS — M2560 Stiffness of unspecified joint, not elsewhere classified: Secondary | ICD-10-CM

## 2014-12-11 DIAGNOSIS — R29898 Other symptoms and signs involving the musculoskeletal system: Secondary | ICD-10-CM

## 2014-12-11 DIAGNOSIS — M545 Low back pain: Secondary | ICD-10-CM | POA: Diagnosis not present

## 2014-12-11 DIAGNOSIS — G8929 Other chronic pain: Secondary | ICD-10-CM

## 2014-12-11 DIAGNOSIS — M5416 Radiculopathy, lumbar region: Principal | ICD-10-CM
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
## 2014-12-12 ENCOUNTER — Encounter (HOSPITAL_COMMUNITY): Payer: 59 | Admitting: Physical Therapy

## 2014-12-15 ENCOUNTER — Ambulatory Visit (HOSPITAL_COMMUNITY): Payer: 59 | Attending: Anesthesiology | Admitting: Physical Therapy

## 2014-12-15 DIAGNOSIS — R262 Difficulty in walking, not elsewhere classified: Secondary | ICD-10-CM | POA: Insufficient documentation

## 2014-12-15 DIAGNOSIS — R531 Weakness: Secondary | ICD-10-CM | POA: Insufficient documentation

## 2014-12-15 DIAGNOSIS — M545 Low back pain: Secondary | ICD-10-CM | POA: Diagnosis present

## 2014-12-15 DIAGNOSIS — M5386 Other specified dorsopathies, lumbar region: Secondary | ICD-10-CM | POA: Insufficient documentation

## 2014-12-15 DIAGNOSIS — R29898 Other symptoms and signs involving the musculoskeletal system: Secondary | ICD-10-CM

## 2014-12-15 DIAGNOSIS — Z7409 Other reduced mobility: Secondary | ICD-10-CM

## 2014-12-15 DIAGNOSIS — G8929 Other chronic pain: Secondary | ICD-10-CM

## 2014-12-15 DIAGNOSIS — M2560 Stiffness of unspecified joint, not elsewhere classified: Secondary | ICD-10-CM

## 2014-12-15 DIAGNOSIS — M5416 Radiculopathy, lumbar region: Secondary | ICD-10-CM
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
## 2014-12-16 ENCOUNTER — Other Ambulatory Visit (HOSPITAL_COMMUNITY): Payer: Self-pay | Admitting: Internal Medicine

## 2014-12-16 DIAGNOSIS — I82402 Acute embolism and thrombosis of unspecified deep veins of left lower extremity: Secondary | ICD-10-CM

## 2014-12-19 ENCOUNTER — Ambulatory Visit (HOSPITAL_COMMUNITY)
Admission: RE | Admit: 2014-12-19 | Discharge: 2014-12-19 | Disposition: A | Discharge status: Home / Self Care | Payer: 59 | Source: Ambulatory Visit | Attending: Internal Medicine | Admitting: Internal Medicine

## 2014-12-19 ENCOUNTER — Ambulatory Visit (HOSPITAL_COMMUNITY): Payer: 59 | Admitting: Physical Therapy

## 2014-12-19 DIAGNOSIS — R29898 Other symptoms and signs involving the musculoskeletal system: Secondary | ICD-10-CM

## 2014-12-19 DIAGNOSIS — M2560 Stiffness of unspecified joint, not elsewhere classified: Secondary | ICD-10-CM

## 2014-12-19 DIAGNOSIS — M545 Low back pain: Secondary | ICD-10-CM | POA: Diagnosis not present

## 2014-12-19 DIAGNOSIS — G8929 Other chronic pain: Secondary | ICD-10-CM

## 2014-12-19 DIAGNOSIS — I82402 Acute embolism and thrombosis of unspecified deep veins of left lower extremity: Secondary | ICD-10-CM | POA: Diagnosis present

## 2014-12-19 DIAGNOSIS — M5416 Radiculopathy, lumbar region: Principal | ICD-10-CM

## 2014-12-19 DIAGNOSIS — Z7409 Other reduced mobility: Secondary | ICD-10-CM

## 2014-12-19 DIAGNOSIS — R262 Difficulty in walking, not elsewhere classified: Secondary | ICD-10-CM
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
## 2014-12-22 ENCOUNTER — Ambulatory Visit (HOSPITAL_COMMUNITY): Payer: 59 | Admitting: Physical Therapy

## 2014-12-22 DIAGNOSIS — M5416 Radiculopathy, lumbar region: Principal | ICD-10-CM

## 2014-12-22 DIAGNOSIS — R29898 Other symptoms and signs involving the musculoskeletal system: Secondary | ICD-10-CM

## 2014-12-22 DIAGNOSIS — G8929 Other chronic pain: Secondary | ICD-10-CM

## 2014-12-22 DIAGNOSIS — M545 Low back pain: Secondary | ICD-10-CM | POA: Diagnosis not present

## 2014-12-22 DIAGNOSIS — M2560 Stiffness of unspecified joint, not elsewhere classified: Secondary | ICD-10-CM

## 2014-12-22 DIAGNOSIS — R262 Difficulty in walking, not elsewhere classified: Secondary | ICD-10-CM

## 2014-12-22 DIAGNOSIS — Z7409 Other reduced mobility: Secondary | ICD-10-CM
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
## 2014-12-26 ENCOUNTER — Ambulatory Visit (HOSPITAL_COMMUNITY): Payer: 59

## 2014-12-26 DIAGNOSIS — G8929 Other chronic pain: Secondary | ICD-10-CM

## 2014-12-26 DIAGNOSIS — R29898 Other symptoms and signs involving the musculoskeletal system: Secondary | ICD-10-CM

## 2014-12-26 DIAGNOSIS — R262 Difficulty in walking, not elsewhere classified: Secondary | ICD-10-CM

## 2014-12-26 DIAGNOSIS — M2560 Stiffness of unspecified joint, not elsewhere classified: Secondary | ICD-10-CM

## 2014-12-26 DIAGNOSIS — Z7409 Other reduced mobility: Secondary | ICD-10-CM

## 2014-12-26 DIAGNOSIS — M545 Low back pain: Secondary | ICD-10-CM | POA: Diagnosis not present

## 2014-12-26 DIAGNOSIS — M5416 Radiculopathy, lumbar region: Principal | ICD-10-CM
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
## 2014-12-29 ENCOUNTER — Ambulatory Visit (HOSPITAL_COMMUNITY): Payer: 59 | Admitting: Physical Therapy

## 2014-12-29 DIAGNOSIS — Z7409 Other reduced mobility: Secondary | ICD-10-CM

## 2014-12-29 DIAGNOSIS — M5416 Radiculopathy, lumbar region: Principal | ICD-10-CM

## 2014-12-29 DIAGNOSIS — R29898 Other symptoms and signs involving the musculoskeletal system: Secondary | ICD-10-CM

## 2014-12-29 DIAGNOSIS — G8929 Other chronic pain: Secondary | ICD-10-CM

## 2014-12-29 DIAGNOSIS — R262 Difficulty in walking, not elsewhere classified: Secondary | ICD-10-CM

## 2014-12-29 DIAGNOSIS — M545 Low back pain: Secondary | ICD-10-CM | POA: Diagnosis not present

## 2014-12-29 DIAGNOSIS — M2560 Stiffness of unspecified joint, not elsewhere classified: Secondary | ICD-10-CM
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
## 2015-01-02 ENCOUNTER — Ambulatory Visit (HOSPITAL_COMMUNITY): Payer: 59 | Admitting: Physical Therapy

## 2015-01-02 DIAGNOSIS — R262 Difficulty in walking, not elsewhere classified: Secondary | ICD-10-CM

## 2015-01-02 DIAGNOSIS — G8929 Other chronic pain: Secondary | ICD-10-CM

## 2015-01-02 DIAGNOSIS — M5416 Radiculopathy, lumbar region: Principal | ICD-10-CM

## 2015-01-02 DIAGNOSIS — M2560 Stiffness of unspecified joint, not elsewhere classified: Secondary | ICD-10-CM

## 2015-01-02 DIAGNOSIS — Z7409 Other reduced mobility: Secondary | ICD-10-CM

## 2015-01-02 DIAGNOSIS — R29898 Other symptoms and signs involving the musculoskeletal system: Secondary | ICD-10-CM

## 2015-01-02 DIAGNOSIS — M545 Low back pain: Secondary | ICD-10-CM | POA: Diagnosis not present
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
## 2015-01-05 ENCOUNTER — Encounter (HOSPITAL_COMMUNITY): Payer: 59 | Admitting: Physical Therapy

## 2015-01-05 ENCOUNTER — Ambulatory Visit (HOSPITAL_COMMUNITY): Payer: 59 | Admitting: Physical Therapy

## 2015-01-05 DIAGNOSIS — M545 Low back pain: Secondary | ICD-10-CM | POA: Diagnosis not present

## 2015-01-05 DIAGNOSIS — R262 Difficulty in walking, not elsewhere classified: Secondary | ICD-10-CM

## 2015-01-05 DIAGNOSIS — M5416 Radiculopathy, lumbar region: Principal | ICD-10-CM

## 2015-01-05 DIAGNOSIS — R29898 Other symptoms and signs involving the musculoskeletal system: Secondary | ICD-10-CM

## 2015-01-05 DIAGNOSIS — G8929 Other chronic pain: Secondary | ICD-10-CM

## 2015-01-05 DIAGNOSIS — M2560 Stiffness of unspecified joint, not elsewhere classified: Secondary | ICD-10-CM

## 2015-01-05 DIAGNOSIS — Z7409 Other reduced mobility: Secondary | ICD-10-CM
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
## 2015-01-14 ENCOUNTER — Encounter (HOSPITAL_COMMUNITY): Payer: 59 | Admitting: Physical Therapy

## 2015-01-15 ENCOUNTER — Ambulatory Visit (HOSPITAL_COMMUNITY): Payer: 59 | Attending: Anesthesiology

## 2015-01-15 DIAGNOSIS — M545 Low back pain: Secondary | ICD-10-CM | POA: Diagnosis present

## 2015-01-15 DIAGNOSIS — Z7409 Other reduced mobility: Secondary | ICD-10-CM

## 2015-01-15 DIAGNOSIS — M5386 Other specified dorsopathies, lumbar region: Secondary | ICD-10-CM | POA: Insufficient documentation

## 2015-01-15 DIAGNOSIS — M5416 Radiculopathy, lumbar region: Secondary | ICD-10-CM

## 2015-01-15 DIAGNOSIS — R262 Difficulty in walking, not elsewhere classified: Secondary | ICD-10-CM | POA: Diagnosis not present

## 2015-01-15 DIAGNOSIS — R531 Weakness: Secondary | ICD-10-CM | POA: Insufficient documentation

## 2015-01-15 DIAGNOSIS — G8929 Other chronic pain: Secondary | ICD-10-CM

## 2015-01-15 DIAGNOSIS — R29898 Other symptoms and signs involving the musculoskeletal system: Secondary | ICD-10-CM

## 2015-01-15 DIAGNOSIS — M2560 Stiffness of unspecified joint, not elsewhere classified: Secondary | ICD-10-CM
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
## 2015-01-21 ENCOUNTER — Ambulatory Visit (HOSPITAL_COMMUNITY): Payer: 59 | Admitting: Physical Therapy

## 2015-01-21 DIAGNOSIS — Z7409 Other reduced mobility: Secondary | ICD-10-CM

## 2015-01-21 DIAGNOSIS — R29898 Other symptoms and signs involving the musculoskeletal system: Secondary | ICD-10-CM

## 2015-01-21 DIAGNOSIS — G8929 Other chronic pain: Secondary | ICD-10-CM

## 2015-01-21 DIAGNOSIS — M545 Low back pain: Secondary | ICD-10-CM | POA: Diagnosis not present

## 2015-01-21 DIAGNOSIS — R262 Difficulty in walking, not elsewhere classified: Secondary | ICD-10-CM

## 2015-01-21 DIAGNOSIS — M5416 Radiculopathy, lumbar region: Principal | ICD-10-CM

## 2015-01-21 DIAGNOSIS — M2560 Stiffness of unspecified joint, not elsewhere classified: Secondary | ICD-10-CM
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
## 2015-01-26 ENCOUNTER — Encounter (HOSPITAL_COMMUNITY): Payer: 59 | Admitting: Physical Therapy

## 2015-03-05 DIAGNOSIS — Z0289 Encounter for other administrative examinations: Secondary | ICD-10-CM

## 2015-07-03 ENCOUNTER — Other Ambulatory Visit (HOSPITAL_COMMUNITY): Payer: Self-pay | Admitting: Gynecology

## 2015-07-03 DIAGNOSIS — Z1231 Encounter for screening mammogram for malignant neoplasm of breast: Secondary | ICD-10-CM

## 2015-07-08 ENCOUNTER — Ambulatory Visit (HOSPITAL_COMMUNITY)
Admission: RE | Admit: 2015-07-08 | Discharge: 2015-07-08 | Disposition: A | Discharge status: Home / Self Care | Payer: 59 | Source: Ambulatory Visit | Attending: Gynecology | Admitting: Gynecology

## 2015-07-08 DIAGNOSIS — Z1231 Encounter for screening mammogram for malignant neoplasm of breast: Secondary | ICD-10-CM | POA: Diagnosis present

## 2015-07-28 ENCOUNTER — Other Ambulatory Visit (HOSPITAL_COMMUNITY): Payer: Self-pay | Admitting: Internal Medicine

## 2015-07-28 DIAGNOSIS — I824Y2 Acute embolism and thrombosis of unspecified deep veins of left proximal lower extremity: Secondary | ICD-10-CM

## 2015-07-31 ENCOUNTER — Ambulatory Visit (HOSPITAL_COMMUNITY)
Admission: RE | Admit: 2015-07-31 | Discharge: 2015-07-31 | Disposition: A | Discharge status: Home / Self Care | Payer: 59 | Source: Ambulatory Visit | Attending: Internal Medicine | Admitting: Internal Medicine

## 2015-07-31 DIAGNOSIS — I824Y2 Acute embolism and thrombosis of unspecified deep veins of left proximal lower extremity: Secondary | ICD-10-CM | POA: Diagnosis not present

## 2015-09-07 MED FILL — ESCITALOPRAM 20 MG TABLET: 20 | 30 days supply | Qty: 30 | Fill #4

## 2015-09-18 DIAGNOSIS — H25013 Cortical age-related cataract, bilateral: Secondary | ICD-10-CM | POA: Diagnosis not present

## 2015-09-18 DIAGNOSIS — H5213 Myopia, bilateral: Secondary | ICD-10-CM | POA: Diagnosis not present

## 2015-09-29 ENCOUNTER — Encounter (HOSPITAL_COMMUNITY): Payer: Self-pay | Admitting: Psychiatry

## 2015-09-29 ENCOUNTER — Ambulatory Visit (INDEPENDENT_AMBULATORY_CARE_PROVIDER_SITE_OTHER): Payer: 59 | Admitting: Psychiatry

## 2015-09-29 VITALS — BP 144/63 | HR 61 | Ht 67.5 in | Wt 155.8 lb

## 2015-09-29 DIAGNOSIS — F4310 Post-traumatic stress disorder, unspecified: Secondary | ICD-10-CM | POA: Diagnosis not present

## 2015-09-29 MED ORDER — DULOXETINE HCL 60 MG PO CPEP: 60.0000 mg | ORAL_CAPSULE | Freq: Every day | ORAL | Status: DC

## 2015-09-29 MED ORDER — CLONAZEPAM 0.5 MG PO TABS: 0.5000 mg | ORAL_TABLET | Freq: Every day | ORAL | Status: DC

## 2015-09-29 MED FILL — DULoxetine HCL 60 MG CPEP: 60 | 30 days supply | Qty: 30 | Fill #0
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
## 2015-09-30 MED FILL — clonazePAM 0.5 MG TABS: 0.5 | 30 days supply | Qty: 30 | Fill #0

## 2015-10-22 ENCOUNTER — Encounter (HOSPITAL_COMMUNITY): Payer: Self-pay | Admitting: Psychiatry

## 2015-10-22 ENCOUNTER — Ambulatory Visit (INDEPENDENT_AMBULATORY_CARE_PROVIDER_SITE_OTHER): Payer: BLUE CROSS/BLUE SHIELD | Admitting: Psychiatry

## 2015-10-22 DIAGNOSIS — F4310 Post-traumatic stress disorder, unspecified: Secondary | ICD-10-CM | POA: Diagnosis not present
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
## 2015-10-22 NOTE — Patient Instructions (Signed)
Discussed orally 

## 2015-10-27 ENCOUNTER — Ambulatory Visit (INDEPENDENT_AMBULATORY_CARE_PROVIDER_SITE_OTHER): Payer: BLUE CROSS/BLUE SHIELD | Admitting: Psychiatry

## 2015-10-27 ENCOUNTER — Encounter (HOSPITAL_COMMUNITY): Payer: Self-pay | Admitting: Psychiatry

## 2015-10-27 VITALS — BP 159/78 | HR 60 | Ht 67.5 in | Wt 156.8 lb

## 2015-10-27 DIAGNOSIS — F4310 Post-traumatic stress disorder, unspecified: Secondary | ICD-10-CM

## 2015-10-27 MED ORDER — DULOXETINE HCL 60 MG PO CPEP: 60.0000 mg | ORAL_CAPSULE | Freq: Every day | ORAL | Status: DC
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
## 2015-11-03 ENCOUNTER — Telehealth (HOSPITAL_COMMUNITY): Payer: Self-pay | Admitting: *Deleted

## 2015-11-03 NOTE — Telephone Encounter (Signed)
phone call from patient regarding Cymbalta 60 mg.   insurance will not pay, she has to pay out of pocket.  Please prescribe something else on her list for her insurance.

## 2015-11-05 ENCOUNTER — Telehealth (HOSPITAL_COMMUNITY): Payer: Self-pay | Admitting: *Deleted

## 2015-11-05 NOTE — Telephone Encounter (Signed)
Called [phone] for prior authorization of Cymbalta. Spoke with Vivien Rota who gave approval [id number] from 11/05/15 until 11/04/16. Called to notify pharmacy. Was told that with insurance the copay will be $50 but if bought through the pharmacy without insurance it will be $21.04. Pt is better off not using insurance for the best price. Pharmacist will notify patient of the difference.

## 2015-11-06 NOTE — Telephone Encounter (Signed)
Called pt and informed her of what message stated and pt showed understanding.

## 2015-11-06 NOTE — Telephone Encounter (Signed)
completed

## 2015-11-10 ENCOUNTER — Telehealth (HOSPITAL_COMMUNITY): Payer: Self-pay | Admitting: *Deleted

## 2015-11-19 ENCOUNTER — Encounter (HOSPITAL_COMMUNITY): Payer: Self-pay | Admitting: Psychiatry

## 2015-11-19 ENCOUNTER — Ambulatory Visit (INDEPENDENT_AMBULATORY_CARE_PROVIDER_SITE_OTHER): Payer: BLUE CROSS/BLUE SHIELD | Admitting: Psychiatry

## 2015-11-19 DIAGNOSIS — F4310 Post-traumatic stress disorder, unspecified: Secondary | ICD-10-CM
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
## 2015-11-19 NOTE — Patient Instructions (Signed)
Discussed orally 

## 2015-12-03 ENCOUNTER — Ambulatory Visit (INDEPENDENT_AMBULATORY_CARE_PROVIDER_SITE_OTHER): Payer: BLUE CROSS/BLUE SHIELD | Admitting: Psychiatry

## 2015-12-03 ENCOUNTER — Encounter (HOSPITAL_COMMUNITY): Payer: Self-pay | Admitting: Psychiatry

## 2015-12-03 DIAGNOSIS — F4310 Post-traumatic stress disorder, unspecified: Secondary | ICD-10-CM
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
## 2015-12-03 NOTE — Patient Instructions (Signed)
Discussed orally 

## 2015-12-08 ENCOUNTER — Encounter (HOSPITAL_COMMUNITY): Payer: Self-pay | Admitting: Psychiatry

## 2015-12-08 ENCOUNTER — Ambulatory Visit (INDEPENDENT_AMBULATORY_CARE_PROVIDER_SITE_OTHER): Payer: BLUE CROSS/BLUE SHIELD | Admitting: Psychiatry

## 2015-12-08 VITALS — BP 174/82 | HR 62 | Ht 67.5 in | Wt 155.2 lb

## 2015-12-08 DIAGNOSIS — F4310 Post-traumatic stress disorder, unspecified: Secondary | ICD-10-CM | POA: Diagnosis not present

## 2015-12-08 MED ORDER — DULOXETINE HCL 60 MG PO CPEP: 60.0000 mg | ORAL_CAPSULE | Freq: Every day | ORAL | Status: DC
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
## 2015-12-21 ENCOUNTER — Encounter (HOSPITAL_COMMUNITY): Payer: Self-pay | Admitting: Psychiatry

## 2015-12-21 ENCOUNTER — Ambulatory Visit (INDEPENDENT_AMBULATORY_CARE_PROVIDER_SITE_OTHER): Payer: BLUE CROSS/BLUE SHIELD | Admitting: Psychiatry

## 2015-12-21 DIAGNOSIS — F4310 Post-traumatic stress disorder, unspecified: Secondary | ICD-10-CM

## 2015-12-21 NOTE — Patient Instructions (Signed)
Discussed orally 
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
## 2016-01-05 ENCOUNTER — Ambulatory Visit (INDEPENDENT_AMBULATORY_CARE_PROVIDER_SITE_OTHER): Payer: BLUE CROSS/BLUE SHIELD | Admitting: Psychiatry

## 2016-01-05 ENCOUNTER — Encounter (HOSPITAL_COMMUNITY): Payer: Self-pay | Admitting: Psychiatry

## 2016-01-05 DIAGNOSIS — F4310 Post-traumatic stress disorder, unspecified: Secondary | ICD-10-CM | POA: Diagnosis not present
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
## 2016-01-05 NOTE — Patient Instructions (Signed)
Discussed orally 

## 2016-01-19 ENCOUNTER — Encounter (HOSPITAL_COMMUNITY): Payer: Self-pay | Admitting: Psychiatry

## 2016-01-19 ENCOUNTER — Ambulatory Visit (INDEPENDENT_AMBULATORY_CARE_PROVIDER_SITE_OTHER): Payer: BLUE CROSS/BLUE SHIELD | Admitting: Psychiatry

## 2016-01-19 DIAGNOSIS — F4310 Post-traumatic stress disorder, unspecified: Secondary | ICD-10-CM

## 2016-01-19 NOTE — Patient Instructions (Signed)
Discussed orally 
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
## 2016-02-03 ENCOUNTER — Ambulatory Visit (INDEPENDENT_AMBULATORY_CARE_PROVIDER_SITE_OTHER): Payer: BLUE CROSS/BLUE SHIELD | Admitting: Psychiatry

## 2016-02-03 ENCOUNTER — Encounter (HOSPITAL_COMMUNITY): Payer: Self-pay | Admitting: Psychiatry

## 2016-02-03 DIAGNOSIS — F4310 Post-traumatic stress disorder, unspecified: Secondary | ICD-10-CM | POA: Diagnosis not present

## 2016-02-03 NOTE — Patient Instructions (Signed)
Discussed orally 
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
## 2016-02-11 ENCOUNTER — Encounter (HOSPITAL_COMMUNITY): Payer: Self-pay | Admitting: Psychiatry

## 2016-02-11 ENCOUNTER — Telehealth (HOSPITAL_COMMUNITY): Payer: Self-pay | Admitting: *Deleted

## 2016-02-11 ENCOUNTER — Ambulatory Visit (INDEPENDENT_AMBULATORY_CARE_PROVIDER_SITE_OTHER): Payer: BLUE CROSS/BLUE SHIELD | Admitting: Psychiatry

## 2016-02-11 DIAGNOSIS — F4310 Post-traumatic stress disorder, unspecified: Secondary | ICD-10-CM

## 2016-02-11 NOTE — Telephone Encounter (Signed)
phone call from attorney's office, need balance for patient.   phone number to billing department was given for balance on patient account.
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
## 2016-02-11 NOTE — Patient Instructions (Signed)
Discussed orally 

## 2016-02-25 ENCOUNTER — Ambulatory Visit (INDEPENDENT_AMBULATORY_CARE_PROVIDER_SITE_OTHER): Payer: BLUE CROSS/BLUE SHIELD | Admitting: Psychiatry

## 2016-02-25 DIAGNOSIS — F4310 Post-traumatic stress disorder, unspecified: Secondary | ICD-10-CM
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
## 2016-02-25 NOTE — Patient Instructions (Signed)
Discussed orally 

## 2016-03-07 ENCOUNTER — Ambulatory Visit (INDEPENDENT_AMBULATORY_CARE_PROVIDER_SITE_OTHER): Payer: Medicare Other | Admitting: Psychiatry

## 2016-03-07 ENCOUNTER — Encounter (HOSPITAL_COMMUNITY): Payer: Self-pay | Admitting: Psychiatry

## 2016-03-07 VITALS — BP 142/90 | HR 67 | Ht 67.5 in | Wt 151.8 lb

## 2016-03-07 DIAGNOSIS — F4310 Post-traumatic stress disorder, unspecified: Secondary | ICD-10-CM

## 2016-03-07 MED ORDER — DULOXETINE HCL 60 MG PO CPEP: 60.0000 mg | ORAL_CAPSULE | Freq: Every day | ORAL | 2 refills | Status: DC
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
## 2016-03-08 ENCOUNTER — Ambulatory Visit (HOSPITAL_COMMUNITY): Payer: Self-pay | Admitting: Psychiatry

## 2016-03-08 DIAGNOSIS — D259 Leiomyoma of uterus, unspecified: Secondary | ICD-10-CM | POA: Diagnosis not present

## 2016-03-08 DIAGNOSIS — Z1231 Encounter for screening mammogram for malignant neoplasm of breast: Secondary | ICD-10-CM | POA: Diagnosis not present

## 2016-03-08 DIAGNOSIS — N920 Excessive and frequent menstruation with regular cycle: Secondary | ICD-10-CM | POA: Diagnosis not present

## 2016-03-08 DIAGNOSIS — N945 Secondary dysmenorrhea: Secondary | ICD-10-CM | POA: Diagnosis not present

## 2016-03-08 DIAGNOSIS — Z124 Encounter for screening for malignant neoplasm of cervix: Secondary | ICD-10-CM | POA: Diagnosis not present

## 2016-03-08 DIAGNOSIS — Z1212 Encounter for screening for malignant neoplasm of rectum: Secondary | ICD-10-CM | POA: Diagnosis not present

## 2016-03-10 ENCOUNTER — Ambulatory Visit (INDEPENDENT_AMBULATORY_CARE_PROVIDER_SITE_OTHER): Payer: Medicare Other | Admitting: Psychiatry

## 2016-03-10 ENCOUNTER — Encounter (HOSPITAL_COMMUNITY): Payer: Self-pay | Admitting: Psychiatry

## 2016-03-10 DIAGNOSIS — F4310 Post-traumatic stress disorder, unspecified: Secondary | ICD-10-CM
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
## 2016-03-30 ENCOUNTER — Encounter (HOSPITAL_COMMUNITY): Payer: Self-pay | Admitting: Psychiatry

## 2016-03-30 ENCOUNTER — Ambulatory Visit (INDEPENDENT_AMBULATORY_CARE_PROVIDER_SITE_OTHER): Payer: Medicare Other | Admitting: Psychiatry

## 2016-03-30 DIAGNOSIS — F4310 Post-traumatic stress disorder, unspecified: Secondary | ICD-10-CM | POA: Diagnosis not present
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
## 2016-04-13 ENCOUNTER — Encounter (HOSPITAL_COMMUNITY): Payer: Self-pay | Admitting: Psychiatry

## 2016-04-13 ENCOUNTER — Ambulatory Visit (INDEPENDENT_AMBULATORY_CARE_PROVIDER_SITE_OTHER): Payer: Medicare Other | Admitting: Psychiatry

## 2016-04-13 DIAGNOSIS — F4310 Post-traumatic stress disorder, unspecified: Secondary | ICD-10-CM
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
## 2016-04-27 ENCOUNTER — Ambulatory Visit (HOSPITAL_COMMUNITY): Payer: Self-pay | Admitting: Psychiatry

## 2016-04-28 ENCOUNTER — Ambulatory Visit (INDEPENDENT_AMBULATORY_CARE_PROVIDER_SITE_OTHER): Payer: Medicare Other | Admitting: Psychiatry

## 2016-04-28 ENCOUNTER — Encounter (HOSPITAL_COMMUNITY): Payer: Self-pay | Admitting: Psychiatry

## 2016-04-28 DIAGNOSIS — F4310 Post-traumatic stress disorder, unspecified: Secondary | ICD-10-CM | POA: Diagnosis not present
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
## 2016-05-12 ENCOUNTER — Ambulatory Visit (HOSPITAL_COMMUNITY): Payer: Self-pay | Admitting: Psychiatry

## 2016-05-21 IMAGING — MR MR HIP*L* W/O CM
4 of 5 series · 26 of 40 positions shown · non-contrast
Comparison: Lumbar MRI 12/10/2013.  Pelvic CT 09/10/2013.

CLINICAL DATA: Left hip pain with pain and numbness in the left
leg. No acute injury or prior relevant surgery.

EXAM:
MR OF THE LEFT HIP WITHOUT CONTRAST
TECHNIQUE: Multiplanar, multisequence MR imaging was performed. No intravenous
contrast was administered.

[Series 2: T1 · axial · 4.0mm · 0.55mm/px · z∈[-124,-9]mm · 7 of 28 slices shown (1 of 2)]
[im 1/28]
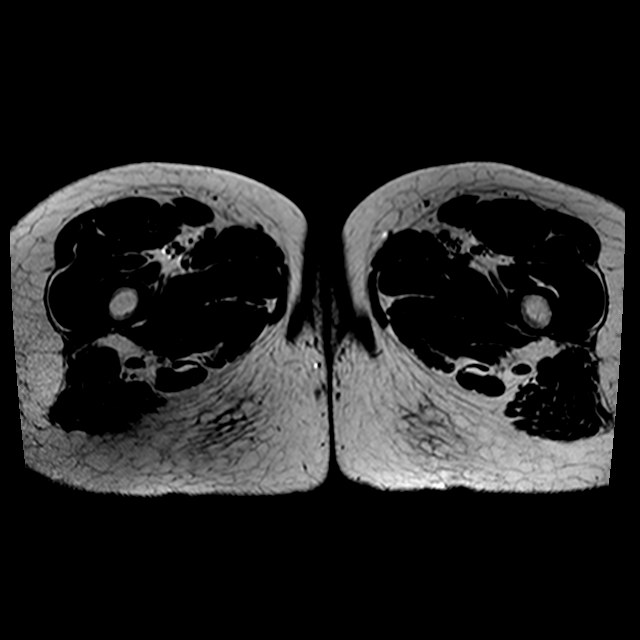
[im 4/28]
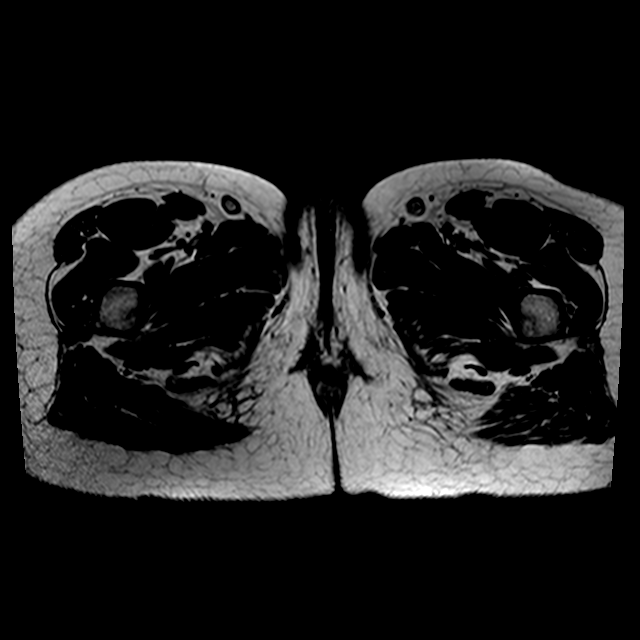
[im 8/28]
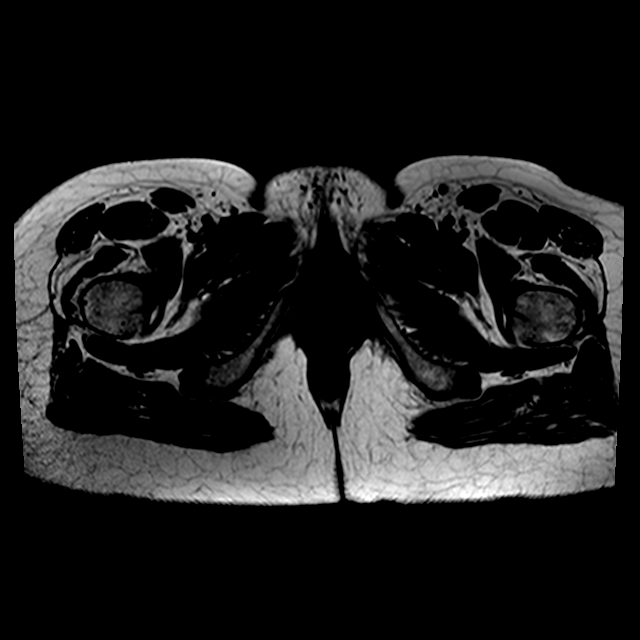
[im 12/28]
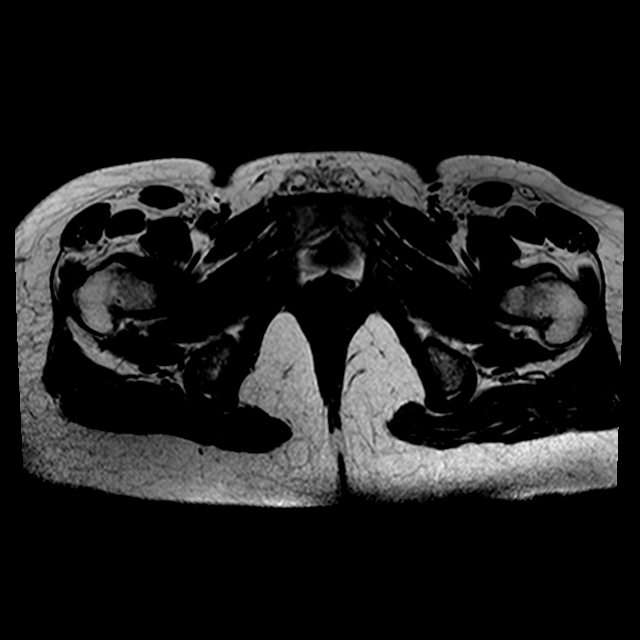
[im 16/28]
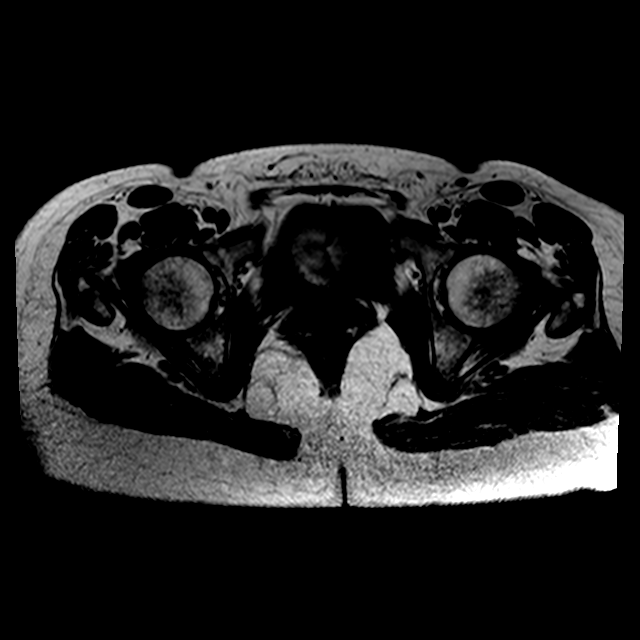
[im 20/28]
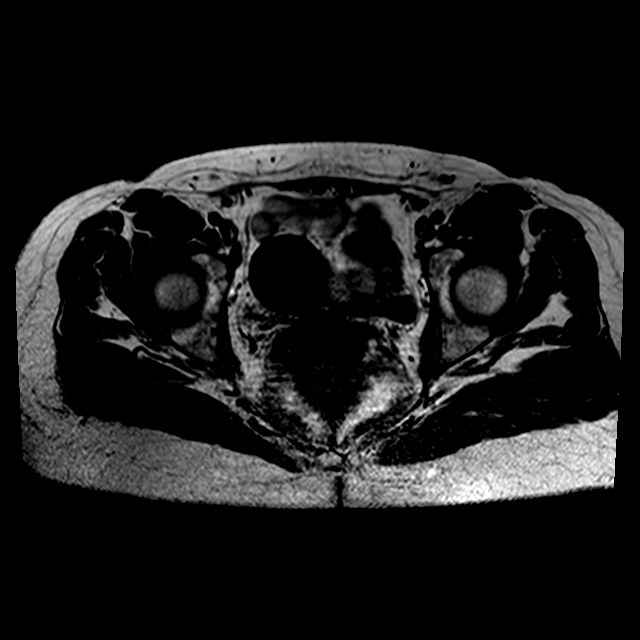
[im 24/28]
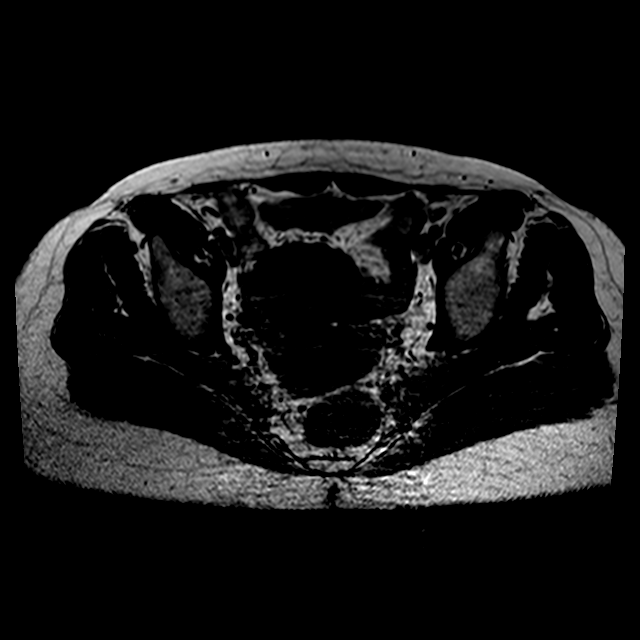

[Series 3: T2 fat-sat · axial · 4.0mm · 1.12mm/px · z∈[-124,+11]mm · 8 of 28 slices shown]
[im 1/28]
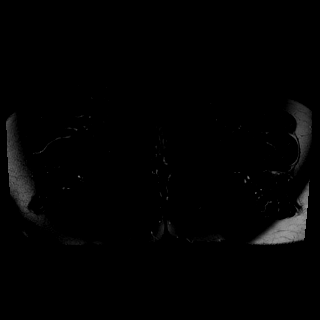
[im 4/28]
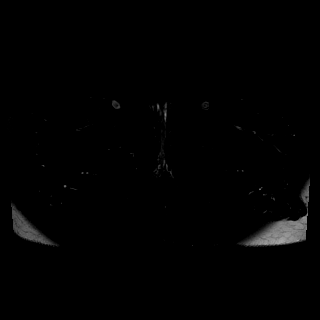
[im 8/28]
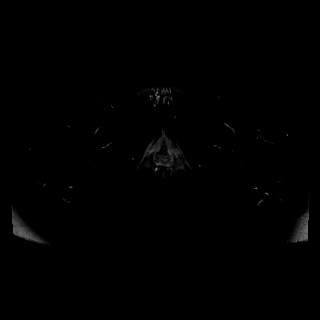
[im 12/28]
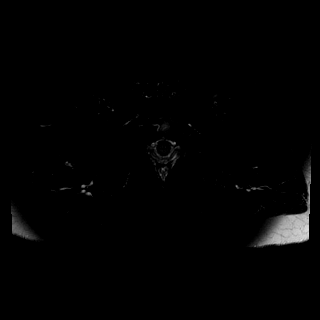
[im 16/28]
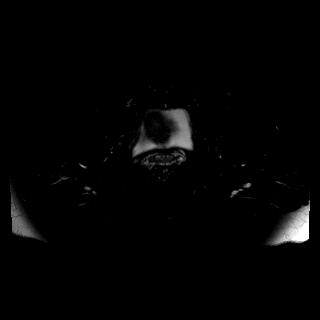
[im 20/28]
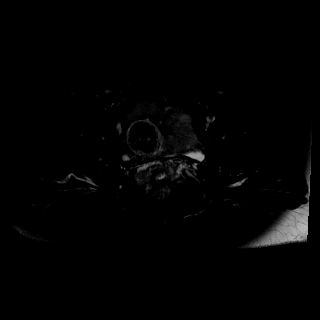
[im 24/28]
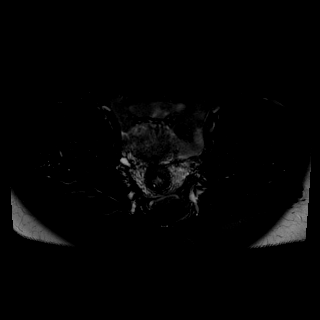
[im 28/28]
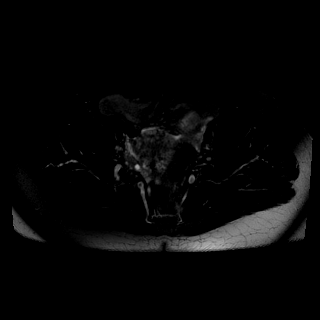

[Series 4: T1 · coronal · 5.0mm · 1.19mm/px · 3 of 25 slices shown (2 of 2)]
[im 4/25]
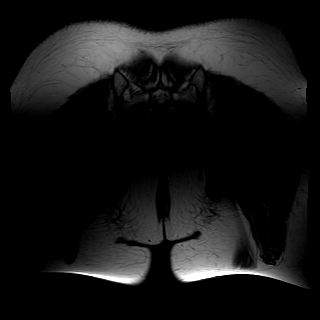
[im 14/25]
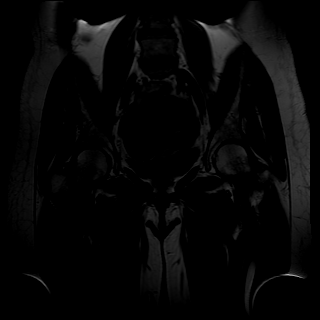
[im 21/25]
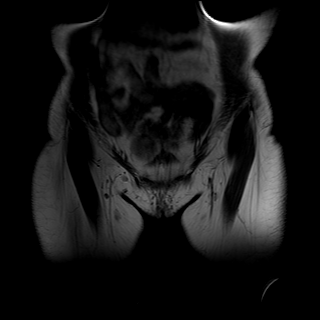

[Series 6: PD fat-sat · sagittal · 4.0mm · 0.36mm/px · 8 of 26 slices shown]
[im 1/26]
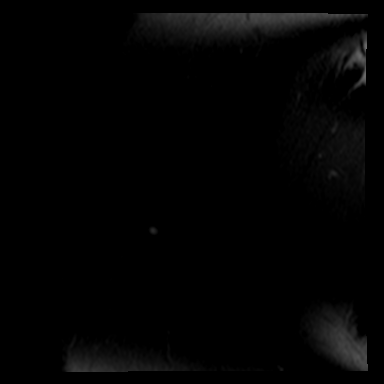
[im 4/26]
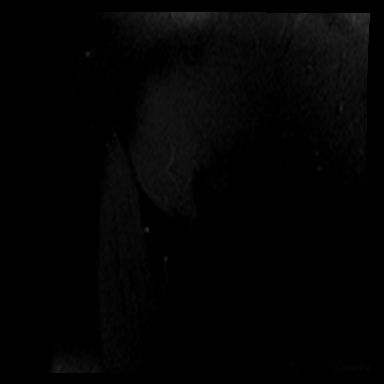
[im 8/26]
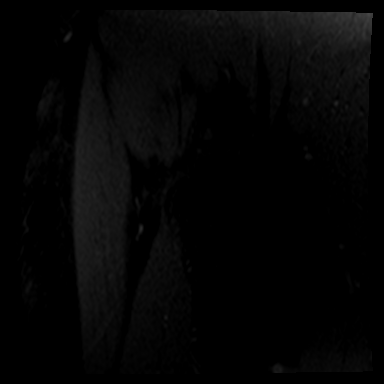
[im 11/26]
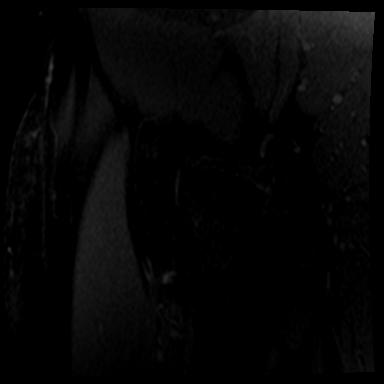
[im 15/26]
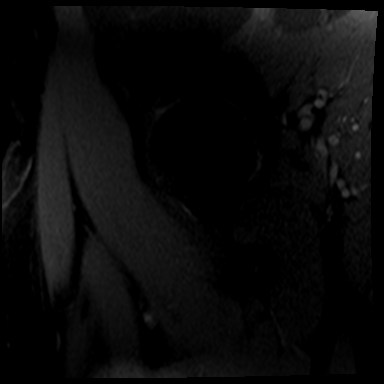
[im 18/26]
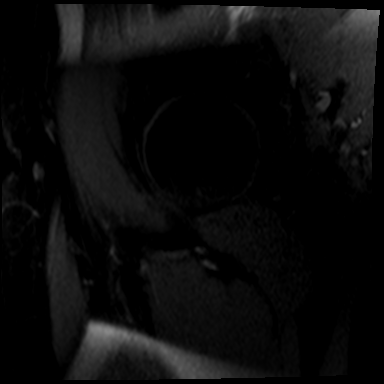
[im 22/26]
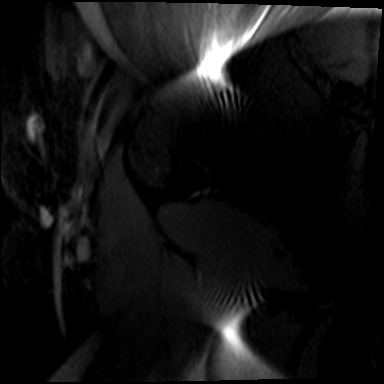
[im 26/26]
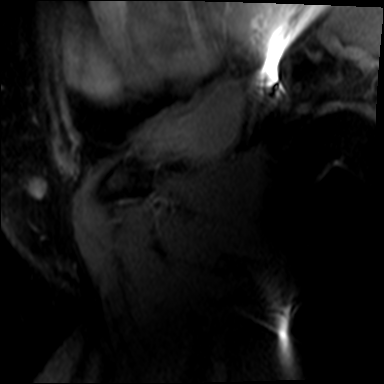

[26 of 40 positions shown; findings below may reference images not displayed]

FINDINGS: Bones: Both femoral heads appear normal without evidence of acute
fracture, dislocation or avascular necrosis. The visualized bony
pelvis appears normal. The sacroiliac joints and symphysis pubis
appear normal.

Articular cartilage and labrum

Articular cartilage: No focal chondral defect or subchondral signal
abnormality identified.

Labrum: There is no gross labral tear or paralabral abnormality.

Joint or bursal effusion

Joint effusion: No significant hip joint effusion.

Bursae: No focal periarticular fluid collection.

Muscles and tendons

Muscles and tendons: The gluteus, hamstring and iliopsoas tendons
appear normal. The piriformis muscles appear symmetric.

Other findings

Miscellaneous: There is an approximately 3.5 cm uterine fibroid. The
visualized internal pelvic contents otherwise appear unremarkable.
IMPRESSION: Negative MRI of the left hip. Uterine fibroid noted incidentally,
grossly stable from prior CT.

## 2016-06-06 ENCOUNTER — Ambulatory Visit (INDEPENDENT_AMBULATORY_CARE_PROVIDER_SITE_OTHER): Payer: Medicare Other | Admitting: Psychiatry

## 2016-06-06 ENCOUNTER — Encounter (HOSPITAL_COMMUNITY): Payer: Self-pay | Admitting: Psychiatry

## 2016-06-06 VITALS — BP 166/80 | HR 80 | Ht 67.5 in | Wt 154.4 lb

## 2016-06-06 DIAGNOSIS — F4310 Post-traumatic stress disorder, unspecified: Secondary | ICD-10-CM | POA: Diagnosis not present

## 2016-06-06 MED ORDER — DULOXETINE HCL 60 MG PO CPEP: 60.0000 mg | ORAL_CAPSULE | Freq: Every day | ORAL | 2 refills | Status: DC

## 2016-06-06 MED ORDER — DEXTROMETHORPHAN-QUINIDINE 20-10 MG PO CAPS: 20.0000 mg | ORAL_CAPSULE | Freq: Two times a day (BID) | ORAL | 2 refills | Status: DC

## 2016-06-06 MED ORDER — CLONAZEPAM 0.5 MG PO TABS: 0.5000 mg | ORAL_TABLET | Freq: Every day | ORAL | 2 refills | Status: DC | PRN
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
## 2016-06-07 ENCOUNTER — Ambulatory Visit (HOSPITAL_COMMUNITY): Payer: Self-pay | Admitting: Psychiatry

## 2016-06-07 ENCOUNTER — Telehealth (HOSPITAL_COMMUNITY): Payer: Self-pay | Admitting: *Deleted

## 2016-06-07 NOTE — Telephone Encounter (Signed)
Prior authorization for Nuedexta received. Called [phone] spoke with pharmacist Kathlee Nations who gave denial based on information in chart "mood lability as she may have possibly had a head injury" as documented from note on 06/06/16. Also on diagnosis of PTSD. She did ask if she had diagnosis of psuedobulbar affect. With this diagnosis an appeal can be done. Will ask MD if appeal should be obtained. Case [id number]. A denial letter will be faxed and an appeal can be started if the MD desires.

## 2016-06-08 NOTE — Telephone Encounter (Signed)
Yes she does

## 2016-06-09 ENCOUNTER — Telehealth (HOSPITAL_COMMUNITY): Payer: Self-pay | Admitting: *Deleted

## 2016-06-09 NOTE — Telephone Encounter (Signed)
Called for appeal of Nuedexta and was given approval based on information that patient does have pseudobulbar affect disorder. Case [id number] per [facility] a pharmacist until 06/09/2019. Called to notify pharmacy, was told by pharmacist that it went through but her copay is $385.

## 2016-06-09 NOTE — Telephone Encounter (Signed)
What type of insurance

## 2016-06-14 NOTE — Telephone Encounter (Signed)
[facility] with [facility]

## 2016-06-21 ENCOUNTER — Ambulatory Visit (INDEPENDENT_AMBULATORY_CARE_PROVIDER_SITE_OTHER): Payer: Medicare Other | Admitting: Psychiatry

## 2016-06-21 ENCOUNTER — Encounter (HOSPITAL_COMMUNITY): Payer: Self-pay | Admitting: Psychiatry

## 2016-06-21 DIAGNOSIS — F4310 Post-traumatic stress disorder, unspecified: Secondary | ICD-10-CM
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
## 2016-07-05 ENCOUNTER — Ambulatory Visit (INDEPENDENT_AMBULATORY_CARE_PROVIDER_SITE_OTHER): Payer: Medicare Other | Admitting: Psychiatry

## 2016-07-05 ENCOUNTER — Encounter (HOSPITAL_COMMUNITY): Payer: Self-pay | Admitting: Psychiatry

## 2016-07-05 VITALS — BP 166/90 | HR 65 | Resp 16 | Wt 158.2 lb

## 2016-07-05 DIAGNOSIS — F4310 Post-traumatic stress disorder, unspecified: Secondary | ICD-10-CM

## 2016-07-05 MED ORDER — DULOXETINE HCL 60 MG PO CPEP: 60.0000 mg | ORAL_CAPSULE | Freq: Every day | ORAL | 2 refills | Status: DC

## 2016-07-05 MED ORDER — CLONAZEPAM 0.5 MG PO TABS: 0.5000 mg | ORAL_TABLET | Freq: Every day | ORAL | 2 refills | Status: DC | PRN

## 2016-07-05 MED ORDER — DEXTROMETHORPHAN-QUINIDINE 20-10 MG PO CAPS: 20.0000 mg | ORAL_CAPSULE | Freq: Two times a day (BID) | ORAL | 2 refills | Status: DC
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
## 2016-07-18 ENCOUNTER — Ambulatory Visit (HOSPITAL_COMMUNITY): Payer: Self-pay | Admitting: Psychiatry

## 2016-08-01 ENCOUNTER — Ambulatory Visit (HOSPITAL_COMMUNITY): Payer: Self-pay | Admitting: Psychiatry

## 2016-08-29 ENCOUNTER — Telehealth: Payer: Self-pay | Admitting: *Deleted

## 2016-08-29 NOTE — Telephone Encounter (Signed)
Dr Jaynee Eagles- would you like her to come in for f/u? If so when? She was last seen by you 06/05/14 for something different

## 2016-08-29 NOTE — Telephone Encounter (Signed)
Called and spoke to pt. Advised her to call PCP to set up f/u with them per AA,MD advice. They can refer her back to our office if needed. She verbalized understanding.

## 2016-08-29 NOTE — Telephone Encounter (Signed)
She should see her primary care first and they can refer her back here of needed thanks

## 2016-09-05 ENCOUNTER — Ambulatory Visit (HOSPITAL_COMMUNITY): Payer: Self-pay | Admitting: Psychiatry
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
## 2016-10-28 ENCOUNTER — Ambulatory Visit (INDEPENDENT_AMBULATORY_CARE_PROVIDER_SITE_OTHER): Payer: Medicare HMO | Admitting: Neurology

## 2016-10-28 ENCOUNTER — Encounter: Payer: Self-pay | Admitting: Neurology

## 2016-10-28 VITALS — BP 133/82 | HR 69 | Ht 67.5 in | Wt 157.6 lb

## 2016-10-28 DIAGNOSIS — G4719 Other hypersomnia: Secondary | ICD-10-CM | POA: Diagnosis not present

## 2016-10-28 DIAGNOSIS — R0681 Apnea, not elsewhere classified: Secondary | ICD-10-CM

## 2016-10-28 DIAGNOSIS — R0683 Snoring: Secondary | ICD-10-CM | POA: Diagnosis not present

## 2016-10-28 DIAGNOSIS — R51 Headache with orthostatic component, not elsewhere classified: Secondary | ICD-10-CM

## 2016-10-28 DIAGNOSIS — R519 Headache, unspecified: Secondary | ICD-10-CM

## 2016-10-28 NOTE — Patient Instructions (Signed)
Remember to drink plenty of fluid, eat healthy meals and do not skip any meals. Try to eat protein with a every meal and eat a healthy snack such as fruit or nuts in between meals. Try to keep a regular sleep-wake schedule and try to exercise daily, particularly in the form of walking, 20-30 minutes a day, if you can.   As far as diagnostic testing: MRI brain w/wo contrast  I would like to see you back after sleep evaluation, sooner if we need to. Please call us with any interim questions, concerns, problems, updates or refill requests.   Our phone number is [phone]. We also have an after hours call service for urgent matters and there is a physician on-call for urgent questions. For any emergencies you know to call 911 or go to the nearest emergency room   Sleep Apnea Sleep apnea is a condition in which breathing pauses or becomes shallow during sleep. Episodes of sleep apnea usually last 10 seconds or longer, and they may occur as many as 20 times an hour. Sleep apnea disrupts your sleep and keeps your body from getting the rest that it needs. This condition can increase your risk of certain health problems, including:  Heart attack.  Stroke.  Obesity.  Diabetes.  Heart failure.  Irregular heartbeat. There are three kinds of sleep apnea:  Obstructive sleep apnea. This kind is caused by a blocked or collapsed airway.  Central sleep apnea. This kind happens when the part of the brain that controls breathing does not send the correct signals to the muscles that control breathing.  Mixed sleep apnea. This is a combination of obstructive and central sleep apnea. What are the causes? The most common cause of this condition is a collapsed or blocked airway. An airway can collapse or become blocked if:  Your throat muscles are abnormally relaxed.  Your tongue and tonsils are larger than normal.  You are overweight.  Your airway is smaller than normal. What increases the  risk? This condition is more likely to develop in people who:  Are overweight.  Smoke.  Have a smaller than normal airway.  Are elderly.  Are female.  Drink alcohol.  Take sedatives or tranquilizers.  Have a family history of sleep apnea. What are the signs or symptoms? Symptoms of this condition include:  Trouble staying asleep.  Daytime sleepiness and tiredness.  Irritability.  Loud snoring.  Morning headaches.  Trouble concentrating.  Forgetfulness.  Decreased interest in sex.  Unexplained sleepiness.  Mood swings.  Personality changes.  Feelings of depression.  Waking up often during the night to urinate.  Dry mouth.  Sore throat. How is this diagnosed? This condition may be diagnosed with:  A medical history.  A physical exam.  A series of tests that are done while you are sleeping (sleep study). These tests are usually done in a sleep lab, but they may also be done at home. How is this treated? Treatment for this condition aims to restore normal breathing and to ease symptoms during sleep. It may involve managing health issues that can affect breathing, such as high blood pressure or obesity. Treatment may include:  Sleeping on your side.  Using a decongestant if you have nasal congestion.  Avoiding the use of depressants, including alcohol, sedatives, and narcotics.  Losing weight if you are overweight.  Making changes to your diet.  Quitting smoking.  Using a device to open your airway while you sleep, such as:  An oral appliance.  This is a custom-made mouthpiece that shifts your lower jaw forward.  A continuous positive airway pressure (CPAP) device. This device delivers oxygen to your airway through a mask.  A nasal expiratory positive airway pressure (EPAP) device. This device has valves that you put into each nostril.  A bi-level positive airway pressure (BPAP) device. This device delivers oxygen to your airway through a  mask.  Surgery if other treatments do not work. During surgery, excess tissue is removed to create a wider airway. It is important to get treatment for sleep apnea. Without treatment, this condition can lead to:  High blood pressure.  Coronary artery disease.  (Men) An inability to achieve or maintain an erection (impotence).  Reduced thinking abilities. Follow these instructions at home:  Make any lifestyle changes that your health care provider recommends.  Eat a healthy, well-balanced diet.  Take over-the-counter and prescription medicines only as told by your health care provider.  Avoid using depressants, including alcohol, sedatives, and narcotics.  Take steps to lose weight if you are overweight.  If you were given a device to open your airway while you sleep, use it only as told by your health care provider.  Do not use any tobacco products, such as cigarettes, chewing tobacco, and e-cigarettes. If you need help quitting, ask your health care provider.  Keep all follow-up visits as told by your health care provider. This is important. Contact a health care provider if:  The device that you received to open your airway during sleep is uncomfortable or does not seem to be working.  Your symptoms do not improve.  Your symptoms get worse. Get help right away if:  You develop chest pain.  You develop shortness of breath.  You develop discomfort in your back, arms, or stomach.  You have trouble speaking.  You have weakness on one side of your body.  You have drooping in your face. These symptoms may represent a serious problem that is an emergency. Do not wait to see if the symptoms will go away. Get medical help right away. Call your local emergency services (911 in the [place] Do not drive yourself to the hospital. This information is not intended to replace advice given to you by your health care provider. Make sure you discuss any questions you have with your  health care provider. Document Released: 07/22/2002 Document Revised: 03/27/2016 Document Reviewed: 05/11/2015 Elsevier Interactive Patient Education  2017 [employer].

## 2016-10-30 NOTE — Addendum Note (Signed)
Addended by: Sarina Ill B on: 10/30/2016 10:08 AM   Modules accepted: Level of Service

## 2016-12-19 ENCOUNTER — Institutional Professional Consult (permissible substitution): Payer: Medicare HMO | Admitting: Neurology

## 2017-01-31 ENCOUNTER — Telehealth: Payer: Self-pay | Admitting: Neurology

## 2017-01-31 NOTE — Telephone Encounter (Signed)
Patient is scheduled to have MRI Brain w/wo contrast at [facility] on Monday June 25 at [place]: [id number] (exp. 01/31/17 to 03/02/17). Patient is aware of time, date & place.Marland Kitchen

## 2017-02-06 ENCOUNTER — Ambulatory Visit (HOSPITAL_COMMUNITY)
Admission: RE | Admit: 2017-02-06 | Discharge: 2017-02-06 | Disposition: A | Discharge status: Home / Self Care | Payer: Medicare HMO | Source: Ambulatory Visit | Attending: Neurology | Admitting: Neurology

## 2017-02-06 DIAGNOSIS — R51 Headache with orthostatic component, not elsewhere classified: Secondary | ICD-10-CM

## 2017-02-06 DIAGNOSIS — R519 Headache, unspecified: Secondary | ICD-10-CM

## 2017-02-06 LAB — POCT I-STAT CREATININE: CREATININE: 0.8 mg/dL (ref 0.44–1.00)

## 2017-02-06 MED ORDER — GADOBENATE DIMEGLUMINE 529 MG/ML IV SOLN
15.0000 mL | Freq: Once | INTRAVENOUS | Status: AC | PRN
  Administered 2017-02-06: 15 mL via INTRAVENOUS

## 2017-02-07 ENCOUNTER — Telehealth: Payer: Self-pay | Admitting: Neurology

## 2017-02-07 NOTE — Telephone Encounter (Signed)
Amber Combs, patient called an dinquired when she would hear from the sleep lab to schedule appt with sleep docs. You have her on your list? thanks

## 2017-02-08 NOTE — Telephone Encounter (Signed)
Message was left for patient to call and schedule. Called back week later and patient said she would call when she was ready to schedule. Documented in chart. Amber Combs will call her again today.

## 2017-03-22 ENCOUNTER — Encounter: Payer: Self-pay | Admitting: Neurology

## 2017-03-22 ENCOUNTER — Ambulatory Visit (INDEPENDENT_AMBULATORY_CARE_PROVIDER_SITE_OTHER): Payer: Medicare HMO | Admitting: Neurology

## 2017-03-22 VITALS — BP 139/77 | HR 56 | Ht 67.0 in | Wt 154.5 lb

## 2017-03-22 DIAGNOSIS — F518 Other sleep disorders not due to a substance or known physiological condition: Secondary | ICD-10-CM | POA: Diagnosis not present

## 2017-03-22 DIAGNOSIS — G4719 Other hypersomnia: Secondary | ICD-10-CM | POA: Diagnosis not present

## 2017-03-22 DIAGNOSIS — G47419 Narcolepsy without cataplexy: Secondary | ICD-10-CM

## 2017-03-22 DIAGNOSIS — R0683 Snoring: Secondary | ICD-10-CM

## 2017-03-22 DIAGNOSIS — G478 Other sleep disorders: Secondary | ICD-10-CM | POA: Diagnosis not present

## 2017-03-22 DIAGNOSIS — G47411 Narcolepsy with cataplexy: Secondary | ICD-10-CM | POA: Diagnosis not present

## 2017-03-22 DIAGNOSIS — F4310 Post-traumatic stress disorder, unspecified: Secondary | ICD-10-CM | POA: Diagnosis not present

## 2017-03-22 DIAGNOSIS — R6889 Other general symptoms and signs: Secondary | ICD-10-CM

## 2017-03-22 NOTE — Patient Instructions (Signed)
I believe you may have a condition called narcolepsy: This means, that you have a sleep disorder that manifests with at times severe excessive sleepiness during the day and often with problems with sleep at night. We may have to try different medications that may help you stay awake during the day. Not everything works with everybody the same way. Wake promoting agents include stimulants and non-stimulant type medications. The most common side effects with stimulants are weight loss, insomnia, nervousness, headaches, palpitations, rise in blood pressure, anxiety. Stimulants can be addictive and subject to abuse. Non-stimulant type wake promoting medications include Provigil and Nuvigil, most common side effects include headaches, nervousness, insomnia, hypertension. In addition there is a medication called Xyrem which has been proven to be very effective in patients with narcolepsy with or without cataplexy. Some patients with narcolepsy report episodes of weakness, such as jaw or facial weakness, legs giving out, feeling wobbly or like "Jell-o", etc. in situations of anxiety, stress, laughter, sudden sadness, surprise, etc., which is called cataplexy. You can also experience episodes of sleep paralysis during which you may feel unable to move upon awakening. Some people experience dreamlike sequences upon awakening or upon drifting off to sleep, called hypnopompic or hypnagogic hallucinations.  
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
## 2017-04-27 ENCOUNTER — Telehealth (HOSPITAL_COMMUNITY): Payer: Self-pay | Admitting: *Deleted

## 2017-04-27 DIAGNOSIS — F29 Unspecified psychosis not due to a substance or known physiological condition: Secondary | ICD-10-CM | POA: Diagnosis not present

## 2017-04-27 DIAGNOSIS — R45851 Suicidal ideations: Secondary | ICD-10-CM | POA: Diagnosis not present

## 2017-04-27 NOTE — Telephone Encounter (Signed)
  She has not been to see me since November. Octavia, please call her to assess severity of symptoms
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
## 2017-04-27 NOTE — Telephone Encounter (Signed)
phone call from patient, she said she is having sucidal thoughts.   She said she is holding on. Scheduled her to be seen on Monday 05/01/17.   She could not come tomorrow due to no transportation.   Told her that if she felt like she might actually harm herself or anyone else to go to the ER.

## 2017-04-27 NOTE — Telephone Encounter (Signed)
Staff called pt pre previous phone call from pt. Provider wanted staff to call pt to get more information and see what's going on and if office needs to do a safety check. Spoke with pt who stated she is currently not suicidal. Per pt she is currently at home by herself and her boyfriend she lives with is at work and wont be home until 5:30-6 pm today. Per pt she feels suicidal meanly when in the morning and at night. Pt then started crying.  Per pt she feels like she just want to go to sleep and don't wake back up. Staff asked pt if she had a plan in how she was going to harm herself and pt stated by hanging herself. Per pt the only reason why she gets out of the bed in the morning is because of her boyfriend dog. Pt then stated she try's to stay busy around the house to keep her mind off of harming herself. Pt stated no one knows about her suicidal thoughts. Staff then called local police officers go to pt home to do a safety check due to what pt stated on phone. The number to the local dispatcher office is [phone]. Staff then came back on the phone with pt and informed her that a local office will be coming by her house to make she gets to the ED to get an evaluation due to suicidal thoughts and pt not having a car to get to ED. Staff then put pt on hold and called local hospital and spoke with ED floor supervisor Nicki to inform them an ambulance and police officers will be escorting pt to their ED to get a psych evaluation. Nicki verbalized understanding. The local hospital name is [facility] in [place]. The officer that came to pt home to do safety check name is Officer Susy Manor who stated they will be completing their safety check and will escort pt to the ED. Staff later verbally informed provider with information.

## 2017-04-27 NOTE — Telephone Encounter (Signed)
Amber Combs, please call her to assess severity of symptoms

## 2017-04-28 NOTE — Telephone Encounter (Signed)
Noted, thanks!

## 2017-05-01 ENCOUNTER — Ambulatory Visit (HOSPITAL_COMMUNITY): Payer: Self-pay | Admitting: Psychiatry

## 2017-05-15 ENCOUNTER — Ambulatory Visit (HOSPITAL_COMMUNITY): Payer: Self-pay | Admitting: Psychiatry

## 2017-05-19 ENCOUNTER — Ambulatory Visit (HOSPITAL_COMMUNITY): Payer: Self-pay | Admitting: Psychiatry

## 2017-05-22 ENCOUNTER — Encounter (HOSPITAL_COMMUNITY): Payer: Self-pay | Admitting: Psychiatry

## 2017-05-22 ENCOUNTER — Ambulatory Visit (INDEPENDENT_AMBULATORY_CARE_PROVIDER_SITE_OTHER): Payer: Medicare HMO | Admitting: Psychiatry

## 2017-05-22 VITALS — BP 142/79 | HR 62 | Ht 67.0 in | Wt 152.0 lb

## 2017-05-22 DIAGNOSIS — F4310 Post-traumatic stress disorder, unspecified: Secondary | ICD-10-CM | POA: Diagnosis not present

## 2017-05-22 DIAGNOSIS — Z79899 Other long term (current) drug therapy: Secondary | ICD-10-CM

## 2017-05-22 DIAGNOSIS — F329 Major depressive disorder, single episode, unspecified: Secondary | ICD-10-CM | POA: Diagnosis not present

## 2017-05-22 DIAGNOSIS — G8929 Other chronic pain: Secondary | ICD-10-CM | POA: Diagnosis not present

## 2017-05-22 MED ORDER — DULOXETINE HCL 60 MG PO CPEP: 60.0000 mg | ORAL_CAPSULE | Freq: Every day | ORAL | 2 refills | Status: DC

## 2017-05-22 MED ORDER — DULOXETINE HCL 30 MG PO CPEP: 30.0000 mg | ORAL_CAPSULE | Freq: Every day | ORAL | 2 refills | Status: DC
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
## 2017-05-25 ENCOUNTER — Encounter (HOSPITAL_COMMUNITY): Payer: Self-pay | Admitting: Psychiatry

## 2017-05-25 ENCOUNTER — Ambulatory Visit (INDEPENDENT_AMBULATORY_CARE_PROVIDER_SITE_OTHER): Payer: Medicare HMO | Admitting: Psychiatry

## 2017-05-25 DIAGNOSIS — F4310 Post-traumatic stress disorder, unspecified: Secondary | ICD-10-CM | POA: Diagnosis not present
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
## 2017-06-14 ENCOUNTER — Ambulatory Visit (INDEPENDENT_AMBULATORY_CARE_PROVIDER_SITE_OTHER): Payer: Medicare HMO | Admitting: Psychiatry

## 2017-06-14 ENCOUNTER — Encounter (HOSPITAL_COMMUNITY): Payer: Self-pay | Admitting: Psychiatry

## 2017-06-14 ENCOUNTER — Ambulatory Visit (HOSPITAL_COMMUNITY): Payer: Self-pay | Admitting: Psychiatry

## 2017-06-14 DIAGNOSIS — F4310 Post-traumatic stress disorder, unspecified: Secondary | ICD-10-CM
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
## 2017-06-20 ENCOUNTER — Ambulatory Visit (INDEPENDENT_AMBULATORY_CARE_PROVIDER_SITE_OTHER): Payer: Medicare HMO | Admitting: Psychiatry

## 2017-06-20 ENCOUNTER — Encounter (HOSPITAL_COMMUNITY): Payer: Self-pay | Admitting: Psychiatry

## 2017-06-20 DIAGNOSIS — M549 Dorsalgia, unspecified: Secondary | ICD-10-CM

## 2017-06-20 DIAGNOSIS — F419 Anxiety disorder, unspecified: Secondary | ICD-10-CM | POA: Diagnosis not present

## 2017-06-20 DIAGNOSIS — F4310 Post-traumatic stress disorder, unspecified: Secondary | ICD-10-CM | POA: Diagnosis not present

## 2017-06-20 DIAGNOSIS — R450 Nervousness: Secondary | ICD-10-CM

## 2017-06-20 DIAGNOSIS — R5381 Other malaise: Secondary | ICD-10-CM

## 2017-06-20 DIAGNOSIS — R5383 Other fatigue: Secondary | ICD-10-CM

## 2017-06-20 DIAGNOSIS — M2550 Pain in unspecified joint: Secondary | ICD-10-CM | POA: Diagnosis not present

## 2017-06-20 MED ORDER — DULOXETINE HCL 60 MG PO CPEP: 60.0000 mg | ORAL_CAPSULE | Freq: Two times a day (BID) | ORAL | 2 refills | Status: DC
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
## 2017-06-27 ENCOUNTER — Ambulatory Visit (INDEPENDENT_AMBULATORY_CARE_PROVIDER_SITE_OTHER): Payer: Medicare HMO | Admitting: Psychiatry

## 2017-06-27 ENCOUNTER — Encounter (HOSPITAL_COMMUNITY): Payer: Self-pay | Admitting: Psychiatry

## 2017-06-27 DIAGNOSIS — F4310 Post-traumatic stress disorder, unspecified: Secondary | ICD-10-CM | POA: Diagnosis not present
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
## 2017-07-05 ENCOUNTER — Ambulatory Visit (INDEPENDENT_AMBULATORY_CARE_PROVIDER_SITE_OTHER): Payer: Medicare HMO | Admitting: Clinical

## 2017-07-05 DIAGNOSIS — F4310 Post-traumatic stress disorder, unspecified: Secondary | ICD-10-CM

## 2017-07-11 ENCOUNTER — Ambulatory Visit (INDEPENDENT_AMBULATORY_CARE_PROVIDER_SITE_OTHER): Payer: Medicare HMO | Admitting: Clinical

## 2017-07-11 ENCOUNTER — Ambulatory Visit (HOSPITAL_COMMUNITY): Payer: Self-pay | Admitting: Psychiatry

## 2017-07-11 DIAGNOSIS — F4310 Post-traumatic stress disorder, unspecified: Secondary | ICD-10-CM | POA: Diagnosis not present

## 2017-07-18 ENCOUNTER — Ambulatory Visit: Payer: Medicare HMO | Admitting: Clinical

## 2017-07-18 ENCOUNTER — Telehealth (HOSPITAL_COMMUNITY): Payer: Self-pay | Admitting: *Deleted

## 2017-07-18 NOTE — Telephone Encounter (Signed)
DR. Harrington Challenger TO CALL ATTORNEY STEPHEN BASS AT 11:00, ON 07/20/17, REGARDING PATIENT.

## 2017-07-20 ENCOUNTER — Ambulatory Visit (INDEPENDENT_AMBULATORY_CARE_PROVIDER_SITE_OTHER): Payer: Medicare HMO | Admitting: Clinical

## 2017-07-20 ENCOUNTER — Ambulatory Visit (HOSPITAL_COMMUNITY): Payer: Self-pay | Admitting: Psychiatry

## 2017-07-20 DIAGNOSIS — F4310 Post-traumatic stress disorder, unspecified: Secondary | ICD-10-CM

## 2017-07-25 ENCOUNTER — Telehealth (HOSPITAL_COMMUNITY): Payer: Self-pay | Admitting: Psychiatry

## 2017-07-25 ENCOUNTER — Other Ambulatory Visit (HOSPITAL_COMMUNITY): Payer: Self-pay | Admitting: Psychiatry

## 2017-07-25 NOTE — Telephone Encounter (Signed)
Returned call to [employer] per request for continuity of care. Left message.

## 2017-08-01 ENCOUNTER — Ambulatory Visit (INDEPENDENT_AMBULATORY_CARE_PROVIDER_SITE_OTHER): Payer: Medicare HMO | Admitting: Psychiatry

## 2017-08-01 ENCOUNTER — Ambulatory Visit: Payer: Medicare HMO | Admitting: Clinical

## 2017-08-01 ENCOUNTER — Encounter (HOSPITAL_COMMUNITY): Payer: Self-pay | Admitting: Psychiatry

## 2017-08-01 DIAGNOSIS — F4310 Post-traumatic stress disorder, unspecified: Secondary | ICD-10-CM

## 2017-08-01 DIAGNOSIS — M549 Dorsalgia, unspecified: Secondary | ICD-10-CM | POA: Diagnosis not present

## 2017-08-01 DIAGNOSIS — F419 Anxiety disorder, unspecified: Secondary | ICD-10-CM

## 2017-08-01 DIAGNOSIS — M2550 Pain in unspecified joint: Secondary | ICD-10-CM | POA: Diagnosis not present

## 2017-08-01 DIAGNOSIS — R450 Nervousness: Secondary | ICD-10-CM

## 2017-08-01 DIAGNOSIS — F332 Major depressive disorder, recurrent severe without psychotic features: Secondary | ICD-10-CM

## 2017-08-01 MED ORDER — CLONAZEPAM 0.5 MG PO TABS: 0.5000 mg | ORAL_TABLET | Freq: Two times a day (BID) | ORAL | 2 refills | Status: DC | PRN

## 2017-08-01 MED ORDER — DULOXETINE HCL 60 MG PO CPEP: 60.0000 mg | ORAL_CAPSULE | Freq: Two times a day (BID) | ORAL | 2 refills | Status: DC
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
## 2017-08-14 ENCOUNTER — Encounter (HOSPITAL_COMMUNITY): Payer: Self-pay | Admitting: Psychiatry

## 2017-08-14 ENCOUNTER — Ambulatory Visit (INDEPENDENT_AMBULATORY_CARE_PROVIDER_SITE_OTHER): Payer: Medicare HMO | Admitting: Psychiatry

## 2017-08-14 DIAGNOSIS — F4310 Post-traumatic stress disorder, unspecified: Secondary | ICD-10-CM | POA: Diagnosis not present
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
## 2017-08-23 ENCOUNTER — Ambulatory Visit (INDEPENDENT_AMBULATORY_CARE_PROVIDER_SITE_OTHER): Payer: Medicare PPO | Admitting: Neurology

## 2017-08-23 DIAGNOSIS — G4719 Other hypersomnia: Secondary | ICD-10-CM

## 2017-08-23 DIAGNOSIS — G47419 Narcolepsy without cataplexy: Secondary | ICD-10-CM

## 2017-08-23 DIAGNOSIS — G478 Other sleep disorders: Secondary | ICD-10-CM

## 2017-08-23 DIAGNOSIS — F4310 Post-traumatic stress disorder, unspecified: Secondary | ICD-10-CM

## 2017-08-23 DIAGNOSIS — R6889 Other general symptoms and signs: Secondary | ICD-10-CM

## 2017-08-23 DIAGNOSIS — R0683 Snoring: Secondary | ICD-10-CM

## 2017-08-23 DIAGNOSIS — G47411 Narcolepsy with cataplexy: Secondary | ICD-10-CM

## 2017-08-23 DIAGNOSIS — G4710 Hypersomnia, unspecified: Secondary | ICD-10-CM | POA: Diagnosis not present

## 2017-08-24 ENCOUNTER — Ambulatory Visit (INDEPENDENT_AMBULATORY_CARE_PROVIDER_SITE_OTHER): Payer: Medicare HMO | Admitting: Neurology

## 2017-08-24 ENCOUNTER — Other Ambulatory Visit: Payer: Self-pay | Admitting: Neurology

## 2017-08-24 DIAGNOSIS — G47411 Narcolepsy with cataplexy: Secondary | ICD-10-CM | POA: Diagnosis not present

## 2017-08-24 DIAGNOSIS — F4310 Post-traumatic stress disorder, unspecified: Secondary | ICD-10-CM

## 2017-08-24 DIAGNOSIS — G4719 Other hypersomnia: Secondary | ICD-10-CM

## 2017-08-24 DIAGNOSIS — G478 Other sleep disorders: Secondary | ICD-10-CM

## 2017-08-24 DIAGNOSIS — R0683 Snoring: Secondary | ICD-10-CM

## 2017-08-24 DIAGNOSIS — R6889 Other general symptoms and signs: Secondary | ICD-10-CM

## 2017-08-24 DIAGNOSIS — G47419 Narcolepsy without cataplexy: Secondary | ICD-10-CM

## 2017-08-24 NOTE — Addendum Note (Signed)
Addended by: Inis Sizer D on: 08/24/2017 12:33 PM   Modules accepted: Orders

## 2017-08-28 ENCOUNTER — Ambulatory Visit (INDEPENDENT_AMBULATORY_CARE_PROVIDER_SITE_OTHER): Payer: Medicare PPO | Admitting: Psychiatry

## 2017-08-28 ENCOUNTER — Encounter (HOSPITAL_COMMUNITY): Payer: Self-pay | Admitting: Psychiatry

## 2017-08-28 DIAGNOSIS — F4310 Post-traumatic stress disorder, unspecified: Secondary | ICD-10-CM
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
## 2017-08-29 ENCOUNTER — Ambulatory Visit (HOSPITAL_COMMUNITY): Payer: Self-pay | Admitting: Psychiatry

## 2017-08-30 LAB — COMPREHENSIVE DRUG ANALYSIS,UR

## 2017-08-31 NOTE — Procedures (Signed)
Name:  Amber Combs, Amber Combs. Reference [id number]  Study Date: 08/24/2017 Procedure #: 1608  DOB: 1966-06-17    Protocol  This is a 13 channel Multiple Sleep Latency Test comprised of 5 channels of EEG (T3-Cz, Cz-T4, F4-M1, C4-M1, O2-M1), 3 channels of Chin EMG, 4 channels of EOG and 1 channel for ECG.   All channels were sampled at 256hz .    This polysomnographic procedure is designed to evaluate (1) the complaint of excessive daytime sleepiness by quantifying the time required to fall asleep and (2) the possibility of narcolepsy by checking for abnormally short latencies to REM sleep.  Electrographic variables include EEG, EMG, EOG and ECG.  Patients are monitored throughout four or five 20-minute opportunities to sleep (naps) at two-hour intervals.  For each nap, the patient is allowed 20 minutes to fall asleep.  Once asleep, the patient is awakened after 15 minutes.  Between naps, the patient is kept as alert as possible.  A sleep latency of 20 minutes indicates that no sleep occurred.  Parametric Analysis  Total Number of Naps 5     NAP # Time of Nap  Sleep Latency (mins) REM Latency (mins) Sleep Time Percent Awake Time Percent  1 07:36 3.5 0 67 33   2 09:32 9 0 62  38   3 11:43 10 0 61  39   4 13:27 20 0 0  100   5 15:28 20 0 0  100    MSLT Summary of Naps  Sleepiness Index: 37.5  Mean Sleep Latency to all Five Naps: 12.5  Mean Sleep Latency to First Four Naps: 10.6  Mean Sleep Latency to First Three Naps: 7.5  Mean Sleep Latency to First Two Naps: 6.25  Number of Naps with REM Sleep: 0    Results from Preceding PSG Study  Sleep Onset Time 21:31 Sleep Efficiency (%) 87.9  Rise Time 05:54 Sleep Latency (min) 10.5  Total Sleep Time  7.5hrs REM Latency (min) 187.5         IMPRESSION:  1. This multiple sleep latency test reveals a mean sleep latency of 12.5 minutes in 5 naps  with 0 out of 5 nap opportunities during which REM sleep was recorded.   2. This study  followed an overnight polysomnogram with a total sleep time (TST) of 451.5 minutes.       Name:  Amber Combs, Amber Combs Reference #:  [id number]  Study Date: 08/24/2017 DOB: 12-13-1965    I attest to having reviewed every epoch of the entire raw data recording prior to the issuance of this report in accordance with the Standards of the American Academy of Sleep Medicine.     RECOMMENDATIONS: This is a normal MSLT study.    Larey Seat, M.D.   08-31-2017  Diplomat, American Board of Psychiatry and Neurology  Diplomat, [place] of Sleep Medicine Market researcher, [place] [facility] at [facility]
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
## 2017-09-04 ENCOUNTER — Ambulatory Visit (INDEPENDENT_AMBULATORY_CARE_PROVIDER_SITE_OTHER): Payer: Medicare PPO | Admitting: Psychiatry

## 2017-09-04 ENCOUNTER — Encounter (HOSPITAL_COMMUNITY): Payer: Self-pay | Admitting: Psychiatry

## 2017-09-04 DIAGNOSIS — Z79899 Other long term (current) drug therapy: Secondary | ICD-10-CM | POA: Diagnosis not present

## 2017-09-04 DIAGNOSIS — Z636 Dependent relative needing care at home: Secondary | ICD-10-CM | POA: Diagnosis not present

## 2017-09-04 DIAGNOSIS — G8929 Other chronic pain: Secondary | ICD-10-CM | POA: Diagnosis not present

## 2017-09-04 DIAGNOSIS — F419 Anxiety disorder, unspecified: Secondary | ICD-10-CM

## 2017-09-04 DIAGNOSIS — Z86718 Personal history of other venous thrombosis and embolism: Secondary | ICD-10-CM

## 2017-09-04 DIAGNOSIS — F4310 Post-traumatic stress disorder, unspecified: Secondary | ICD-10-CM

## 2017-09-04 DIAGNOSIS — F329 Major depressive disorder, single episode, unspecified: Secondary | ICD-10-CM | POA: Diagnosis not present

## 2017-09-04 MED ORDER — DULOXETINE HCL 60 MG PO CPEP
60.0000 mg | ORAL_CAPSULE | Freq: Two times a day (BID) | ORAL | 2 refills | Status: DC
Start: 2017-09-04 — End: 2017-11-02
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
## 2017-09-05 ENCOUNTER — Telehealth: Payer: Self-pay | Admitting: Neurology

## 2017-09-05 NOTE — Telephone Encounter (Signed)
-----   Message from Larey Seat, MD sent at 08/31/2017  5:31 PM EST ----- [place] asleep in 5 naps, but did not go into REM sleep. Diagnosis of Narcolepsy can not be confirmed.  No follow up in sleep clinic needed- please defer to psychology/ psychiatry .

## 2017-09-05 NOTE — Telephone Encounter (Signed)
Called and spoke to the patient and made her aware that the sleep study was negative for sleep apnea, the daytime sleep test was negative for Narcolepsy. I did inform the patient that with her testing positive for cymbalta and that being a medication that can hinder a accurate test its hard to validate this test as accurate. The pt verbalized understanding and had no further questions.

## 2017-09-13 ENCOUNTER — Ambulatory Visit (HOSPITAL_COMMUNITY): Payer: Self-pay | Admitting: Psychiatry

## 2017-09-27 ENCOUNTER — Ambulatory Visit (HOSPITAL_COMMUNITY): Payer: Self-pay | Admitting: Psychiatry

## 2017-10-12 ENCOUNTER — Encounter: Payer: Medicare HMO | Admitting: Psychology

## 2017-10-25 ENCOUNTER — Encounter (HOSPITAL_COMMUNITY): Payer: Self-pay | Admitting: Psychiatry

## 2017-10-25 ENCOUNTER — Telehealth (HOSPITAL_COMMUNITY): Payer: Self-pay

## 2017-10-25 ENCOUNTER — Ambulatory Visit (INDEPENDENT_AMBULATORY_CARE_PROVIDER_SITE_OTHER): Payer: Medicare PPO | Admitting: Psychiatry

## 2017-10-25 DIAGNOSIS — F4310 Post-traumatic stress disorder, unspecified: Secondary | ICD-10-CM | POA: Diagnosis not present

## 2017-10-25 NOTE — Telephone Encounter (Signed)
Sent in 1/21/ with 2 refills

## 2017-10-25 NOTE — Telephone Encounter (Signed)
Patient is requesting a refill on Cymbalta 60mg . Please advise
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
## 2017-10-25 NOTE — Telephone Encounter (Signed)
Called patient and left voicemail message about medication request

## 2017-11-02 ENCOUNTER — Encounter (HOSPITAL_COMMUNITY): Payer: Self-pay | Admitting: Psychiatry

## 2017-11-02 ENCOUNTER — Ambulatory Visit (INDEPENDENT_AMBULATORY_CARE_PROVIDER_SITE_OTHER): Payer: Medicare PPO | Admitting: Psychiatry

## 2017-11-02 DIAGNOSIS — M2550 Pain in unspecified joint: Secondary | ICD-10-CM | POA: Diagnosis not present

## 2017-11-02 DIAGNOSIS — F4310 Post-traumatic stress disorder, unspecified: Secondary | ICD-10-CM | POA: Diagnosis not present

## 2017-11-02 DIAGNOSIS — R450 Nervousness: Secondary | ICD-10-CM

## 2017-11-02 DIAGNOSIS — F419 Anxiety disorder, unspecified: Secondary | ICD-10-CM | POA: Diagnosis not present

## 2017-11-02 DIAGNOSIS — M549 Dorsalgia, unspecified: Secondary | ICD-10-CM

## 2017-11-02 MED ORDER — DULOXETINE HCL 60 MG PO CPEP
60.0000 mg | ORAL_CAPSULE | Freq: Two times a day (BID) | ORAL | 2 refills | Status: DC
Start: 2017-11-02 — End: 2018-01-02
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
## 2018-01-02 ENCOUNTER — Encounter (HOSPITAL_COMMUNITY): Payer: Self-pay | Admitting: Psychiatry

## 2018-01-02 ENCOUNTER — Ambulatory Visit (INDEPENDENT_AMBULATORY_CARE_PROVIDER_SITE_OTHER): Payer: Medicare PPO | Admitting: Psychiatry

## 2018-01-02 DIAGNOSIS — Z79899 Other long term (current) drug therapy: Secondary | ICD-10-CM

## 2018-01-02 DIAGNOSIS — M2550 Pain in unspecified joint: Secondary | ICD-10-CM | POA: Diagnosis not present

## 2018-01-02 DIAGNOSIS — Z8659 Personal history of other mental and behavioral disorders: Secondary | ICD-10-CM

## 2018-01-02 DIAGNOSIS — Z636 Dependent relative needing care at home: Secondary | ICD-10-CM | POA: Diagnosis not present

## 2018-01-02 DIAGNOSIS — M549 Dorsalgia, unspecified: Secondary | ICD-10-CM | POA: Diagnosis not present

## 2018-01-02 DIAGNOSIS — F4310 Post-traumatic stress disorder, unspecified: Secondary | ICD-10-CM | POA: Diagnosis not present

## 2018-01-02 MED ORDER — DULOXETINE HCL 60 MG PO CPEP: 60.0000 mg | ORAL_CAPSULE | Freq: Two times a day (BID) | ORAL | 2 refills | Status: DC
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
## 2018-01-29 DIAGNOSIS — M545 Low back pain: Secondary | ICD-10-CM | POA: Diagnosis not present

## 2018-01-29 DIAGNOSIS — M5441 Lumbago with sciatica, right side: Secondary | ICD-10-CM | POA: Diagnosis not present

## 2018-01-29 DIAGNOSIS — M9903 Segmental and somatic dysfunction of lumbar region: Secondary | ICD-10-CM | POA: Diagnosis not present

## 2018-01-30 DIAGNOSIS — M9903 Segmental and somatic dysfunction of lumbar region: Secondary | ICD-10-CM | POA: Diagnosis not present

## 2018-01-30 DIAGNOSIS — M545 Low back pain: Secondary | ICD-10-CM | POA: Diagnosis not present

## 2018-01-30 DIAGNOSIS — M5441 Lumbago with sciatica, right side: Secondary | ICD-10-CM | POA: Diagnosis not present

## 2018-02-01 DIAGNOSIS — M9903 Segmental and somatic dysfunction of lumbar region: Secondary | ICD-10-CM | POA: Diagnosis not present

## 2018-02-01 DIAGNOSIS — M5441 Lumbago with sciatica, right side: Secondary | ICD-10-CM | POA: Diagnosis not present

## 2018-02-01 DIAGNOSIS — M545 Low back pain: Secondary | ICD-10-CM | POA: Diagnosis not present

## 2018-02-05 DIAGNOSIS — M9903 Segmental and somatic dysfunction of lumbar region: Secondary | ICD-10-CM | POA: Diagnosis not present

## 2018-02-05 DIAGNOSIS — M545 Low back pain: Secondary | ICD-10-CM | POA: Diagnosis not present

## 2018-02-05 DIAGNOSIS — M5441 Lumbago with sciatica, right side: Secondary | ICD-10-CM | POA: Diagnosis not present

## 2018-02-06 DIAGNOSIS — M5441 Lumbago with sciatica, right side: Secondary | ICD-10-CM | POA: Diagnosis not present

## 2018-02-06 DIAGNOSIS — M9903 Segmental and somatic dysfunction of lumbar region: Secondary | ICD-10-CM | POA: Diagnosis not present

## 2018-02-06 DIAGNOSIS — M545 Low back pain: Secondary | ICD-10-CM | POA: Diagnosis not present

## 2018-02-08 DIAGNOSIS — M5441 Lumbago with sciatica, right side: Secondary | ICD-10-CM | POA: Diagnosis not present

## 2018-02-08 DIAGNOSIS — M9903 Segmental and somatic dysfunction of lumbar region: Secondary | ICD-10-CM | POA: Diagnosis not present

## 2018-02-08 DIAGNOSIS — M545 Low back pain: Secondary | ICD-10-CM | POA: Diagnosis not present

## 2018-03-05 ENCOUNTER — Encounter (HOSPITAL_COMMUNITY): Payer: Self-pay | Admitting: Psychiatry

## 2018-03-05 ENCOUNTER — Ambulatory Visit (INDEPENDENT_AMBULATORY_CARE_PROVIDER_SITE_OTHER): Payer: Medicare PPO | Admitting: Psychiatry

## 2018-03-05 DIAGNOSIS — F4310 Post-traumatic stress disorder, unspecified: Secondary | ICD-10-CM

## 2018-03-05 DIAGNOSIS — R450 Nervousness: Secondary | ICD-10-CM

## 2018-03-05 DIAGNOSIS — M545 Low back pain: Secondary | ICD-10-CM | POA: Diagnosis not present

## 2018-03-05 DIAGNOSIS — R5381 Other malaise: Secondary | ICD-10-CM

## 2018-03-05 MED ORDER — DULOXETINE HCL 60 MG PO CPEP: 60.0000 mg | ORAL_CAPSULE | Freq: Two times a day (BID) | ORAL | 2 refills | Status: DC
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
## 2018-04-05 DIAGNOSIS — I10 Essential (primary) hypertension: Secondary | ICD-10-CM | POA: Diagnosis not present

## 2018-04-30 ENCOUNTER — Ambulatory Visit (INDEPENDENT_AMBULATORY_CARE_PROVIDER_SITE_OTHER): Payer: Medicare PPO | Admitting: Psychiatry

## 2018-04-30 DIAGNOSIS — F4310 Post-traumatic stress disorder, unspecified: Secondary | ICD-10-CM | POA: Diagnosis not present
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
## 2018-05-07 ENCOUNTER — Encounter (HOSPITAL_COMMUNITY): Payer: Self-pay | Admitting: Psychiatry

## 2018-05-07 ENCOUNTER — Ambulatory Visit (INDEPENDENT_AMBULATORY_CARE_PROVIDER_SITE_OTHER): Payer: Medicare PPO | Admitting: Psychiatry

## 2018-05-07 DIAGNOSIS — F4310 Post-traumatic stress disorder, unspecified: Secondary | ICD-10-CM | POA: Diagnosis not present

## 2018-05-07 MED ORDER — DULOXETINE HCL 60 MG PO CPEP: 60.0000 mg | ORAL_CAPSULE | Freq: Two times a day (BID) | ORAL | 2 refills | Status: DC
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
## 2018-06-21 ENCOUNTER — Ambulatory Visit (INDEPENDENT_AMBULATORY_CARE_PROVIDER_SITE_OTHER): Payer: Medicare PPO | Admitting: Psychiatry

## 2018-06-21 DIAGNOSIS — F4310 Post-traumatic stress disorder, unspecified: Secondary | ICD-10-CM
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
## 2018-07-04 ENCOUNTER — Ambulatory Visit (INDEPENDENT_AMBULATORY_CARE_PROVIDER_SITE_OTHER): Payer: Medicare PPO | Admitting: Psychiatry

## 2018-07-04 ENCOUNTER — Encounter (HOSPITAL_COMMUNITY): Payer: Self-pay | Admitting: Psychiatry

## 2018-07-04 DIAGNOSIS — F4310 Post-traumatic stress disorder, unspecified: Secondary | ICD-10-CM | POA: Diagnosis not present
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
## 2018-07-25 ENCOUNTER — Ambulatory Visit (HOSPITAL_COMMUNITY): Payer: Self-pay | Admitting: Psychiatry

## 2018-08-01 ENCOUNTER — Ambulatory Visit (INDEPENDENT_AMBULATORY_CARE_PROVIDER_SITE_OTHER): Payer: Medicare PPO | Admitting: Psychiatry

## 2018-08-01 ENCOUNTER — Encounter (HOSPITAL_COMMUNITY): Payer: Self-pay | Admitting: Psychiatry

## 2018-08-01 DIAGNOSIS — F4310 Post-traumatic stress disorder, unspecified: Secondary | ICD-10-CM | POA: Diagnosis not present

## 2018-08-01 MED ORDER — DULOXETINE HCL 60 MG PO CPEP: 60.0000 mg | ORAL_CAPSULE | Freq: Every day | ORAL | 2 refills | Status: DC

## 2018-08-01 MED ORDER — BUPROPION HCL ER (XL) 150 MG PO TB24: 150.0000 mg | ORAL_TABLET | ORAL | 2 refills | Status: DC
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
## 2018-08-06 ENCOUNTER — Ambulatory Visit (INDEPENDENT_AMBULATORY_CARE_PROVIDER_SITE_OTHER): Payer: Medicare PPO | Admitting: Psychiatry

## 2018-08-06 DIAGNOSIS — F4310 Post-traumatic stress disorder, unspecified: Secondary | ICD-10-CM
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
## 2018-08-13 ENCOUNTER — Ambulatory Visit (HOSPITAL_COMMUNITY): Payer: Medicare PPO | Admitting: Psychiatry

## 2018-08-20 ENCOUNTER — Ambulatory Visit (INDEPENDENT_AMBULATORY_CARE_PROVIDER_SITE_OTHER): Payer: Medicare PPO | Admitting: Psychiatry

## 2018-08-20 ENCOUNTER — Encounter (HOSPITAL_COMMUNITY): Payer: Self-pay | Admitting: Psychiatry

## 2018-08-20 DIAGNOSIS — F4310 Post-traumatic stress disorder, unspecified: Secondary | ICD-10-CM
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
## 2018-09-13 ENCOUNTER — Ambulatory Visit (HOSPITAL_COMMUNITY): Payer: Medicare PPO | Admitting: Psychiatry

## 2018-09-18 ENCOUNTER — Ambulatory Visit (INDEPENDENT_AMBULATORY_CARE_PROVIDER_SITE_OTHER): Payer: Medicare PPO | Admitting: Psychiatry

## 2018-09-18 ENCOUNTER — Encounter (HOSPITAL_COMMUNITY): Payer: Self-pay | Admitting: Psychiatry

## 2018-09-18 DIAGNOSIS — F4310 Post-traumatic stress disorder, unspecified: Secondary | ICD-10-CM
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
## 2018-09-20 ENCOUNTER — Ambulatory Visit (HOSPITAL_COMMUNITY): Payer: Medicare PPO | Admitting: Psychiatry

## 2018-09-26 ENCOUNTER — Ambulatory Visit (INDEPENDENT_AMBULATORY_CARE_PROVIDER_SITE_OTHER): Payer: Medicare PPO | Admitting: Psychiatry

## 2018-09-26 ENCOUNTER — Encounter (HOSPITAL_COMMUNITY): Payer: Self-pay | Admitting: Psychiatry

## 2018-09-26 VITALS — BP 148/84 | HR 74 | Ht 67.0 in | Wt 156.0 lb

## 2018-09-26 DIAGNOSIS — F4310 Post-traumatic stress disorder, unspecified: Secondary | ICD-10-CM

## 2018-09-26 MED ORDER — DULOXETINE HCL 60 MG PO CPEP: 60.0000 mg | ORAL_CAPSULE | Freq: Every day | ORAL | 2 refills | Status: DC

## 2018-09-26 MED ORDER — BUPROPION HCL ER (XL) 150 MG PO TB24: 150.0000 mg | ORAL_TABLET | ORAL | 2 refills | Status: DC
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
## 2018-10-02 ENCOUNTER — Encounter (HOSPITAL_COMMUNITY): Payer: Self-pay | Admitting: Psychiatry

## 2018-10-02 ENCOUNTER — Ambulatory Visit (INDEPENDENT_AMBULATORY_CARE_PROVIDER_SITE_OTHER): Payer: Medicare PPO | Admitting: Psychiatry

## 2018-10-02 DIAGNOSIS — F4310 Post-traumatic stress disorder, unspecified: Secondary | ICD-10-CM
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
## 2018-10-15 ENCOUNTER — Encounter: Payer: Self-pay | Admitting: Cardiology

## 2018-10-16 ENCOUNTER — Ambulatory Visit (INDEPENDENT_AMBULATORY_CARE_PROVIDER_SITE_OTHER): Payer: Medicare PPO | Admitting: Psychiatry

## 2018-10-16 ENCOUNTER — Encounter (HOSPITAL_COMMUNITY): Payer: Self-pay | Admitting: Psychiatry

## 2018-10-16 DIAGNOSIS — F4310 Post-traumatic stress disorder, unspecified: Secondary | ICD-10-CM
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
## 2018-10-29 ENCOUNTER — Telehealth: Payer: Self-pay | Admitting: Internal Medicine

## 2018-10-29 NOTE — Telephone Encounter (Signed)
Called pt  I have reviewed records No acute problems or symptoms Given corona virus outbreak would recomm rescheduling for some time this summer   Pt understands and agrees   Will call with problems Will have sched reschedule

## 2018-11-01 ENCOUNTER — Other Ambulatory Visit (HOSPITAL_COMMUNITY): Payer: Self-pay | Admitting: Psychiatry

## 2018-11-01 DIAGNOSIS — F4310 Post-traumatic stress disorder, unspecified: Secondary | ICD-10-CM

## 2018-11-01 MED ORDER — BUPROPION HCL ER (XL) 150 MG PO TB24: 150.0000 mg | ORAL_TABLET | ORAL | 2 refills | Status: DC

## 2018-11-01 MED ORDER — DULOXETINE HCL 60 MG PO CPEP: 60.0000 mg | ORAL_CAPSULE | Freq: Every day | ORAL | 2 refills | Status: DC

## 2018-11-02 ENCOUNTER — Ambulatory Visit: Payer: Self-pay | Admitting: Internal Medicine

## 2018-11-23 ENCOUNTER — Ambulatory Visit (HOSPITAL_COMMUNITY): Payer: Medicare PPO | Admitting: Psychiatry

## 2018-12-06 ENCOUNTER — Ambulatory Visit (HOSPITAL_COMMUNITY): Payer: Medicare PPO | Admitting: Psychiatry

## 2018-12-25 ENCOUNTER — Encounter (HOSPITAL_COMMUNITY): Payer: Self-pay | Admitting: Psychiatry

## 2018-12-25 ENCOUNTER — Ambulatory Visit (INDEPENDENT_AMBULATORY_CARE_PROVIDER_SITE_OTHER): Payer: Medicare PPO | Admitting: Psychiatry

## 2018-12-25 ENCOUNTER — Other Ambulatory Visit: Payer: Self-pay

## 2018-12-25 DIAGNOSIS — F4310 Post-traumatic stress disorder, unspecified: Secondary | ICD-10-CM | POA: Diagnosis not present

## 2018-12-25 MED ORDER — BUPROPION HCL ER (XL) 300 MG PO TB24: 300.0000 mg | ORAL_TABLET | ORAL | 2 refills | Status: DC
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
## 2018-12-27 ENCOUNTER — Ambulatory Visit (INDEPENDENT_AMBULATORY_CARE_PROVIDER_SITE_OTHER): Payer: Medicare PPO | Admitting: Psychiatry

## 2018-12-27 ENCOUNTER — Other Ambulatory Visit: Payer: Self-pay

## 2018-12-27 DIAGNOSIS — F4310 Post-traumatic stress disorder, unspecified: Secondary | ICD-10-CM
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
## 2018-12-31 ENCOUNTER — Ambulatory Visit: Payer: Self-pay | Admitting: Internal Medicine

## 2019-01-03 ENCOUNTER — Telehealth (HOSPITAL_COMMUNITY): Payer: Self-pay | Admitting: *Deleted

## 2019-01-03 ENCOUNTER — Other Ambulatory Visit (HOSPITAL_COMMUNITY): Payer: Self-pay | Admitting: Psychiatry

## 2019-01-03 MED ORDER — DULOXETINE HCL 60 MG PO CPEP: 60.0000 mg | ORAL_CAPSULE | Freq: Every day | ORAL | 2 refills | Status: DC

## 2019-01-03 NOTE — Telephone Encounter (Signed)
Spoke with patient & informed per provider: Tell her to continue the cymbalta 60 mg with the wellbutrin 300 mg until I see her

## 2019-01-03 NOTE — Telephone Encounter (Signed)
Dr Efrain Sella called & said that "she is falling apart" She can't wean cold [place] off the DULOXETINE. She asked if she could please be weaned off. She wants to taper off. She did end taking 1 today  & 1 last night (which would help BUT not this time) . She's jittery, stomach cramps, affecting her thoughts. Feels like she's  going thru withdrawal's

## 2019-01-03 NOTE — Telephone Encounter (Signed)
Tell her to continue the cymbalta 60 mg with the wellbutrin 300 mg until I see her

## 2019-01-08 ENCOUNTER — Telehealth (HOSPITAL_COMMUNITY): Payer: Self-pay | Admitting: *Deleted

## 2019-01-08 ENCOUNTER — Other Ambulatory Visit: Payer: Self-pay

## 2019-01-08 ENCOUNTER — Ambulatory Visit (INDEPENDENT_AMBULATORY_CARE_PROVIDER_SITE_OTHER): Payer: Medicare PPO | Admitting: Psychiatry

## 2019-01-08 DIAGNOSIS — F4310 Post-traumatic stress disorder, unspecified: Secondary | ICD-10-CM

## 2019-01-08 NOTE — Telephone Encounter (Signed)
UPDATED PHONE #
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
## 2019-01-23 ENCOUNTER — Ambulatory Visit (HOSPITAL_COMMUNITY): Payer: Medicare PPO | Admitting: Psychiatry

## 2019-02-05 ENCOUNTER — Other Ambulatory Visit: Payer: Self-pay

## 2019-02-05 ENCOUNTER — Ambulatory Visit (INDEPENDENT_AMBULATORY_CARE_PROVIDER_SITE_OTHER): Payer: Medicare PPO | Admitting: Psychiatry

## 2019-02-05 ENCOUNTER — Encounter (HOSPITAL_COMMUNITY): Payer: Self-pay | Admitting: Psychiatry

## 2019-02-05 DIAGNOSIS — F4310 Post-traumatic stress disorder, unspecified: Secondary | ICD-10-CM | POA: Diagnosis not present

## 2019-02-05 MED ORDER — BUPROPION HCL ER (XL) 300 MG PO TB24: 300.0000 mg | ORAL_TABLET | ORAL | 2 refills | Status: DC

## 2019-02-05 MED ORDER — DULOXETINE HCL 60 MG PO CPEP: 60.0000 mg | ORAL_CAPSULE | Freq: Every day | ORAL | 2 refills | Status: DC
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
## 2019-02-06 ENCOUNTER — Ambulatory Visit (HOSPITAL_COMMUNITY): Payer: Medicare PPO | Admitting: Psychiatry

## 2019-02-14 ENCOUNTER — Ambulatory Visit: Payer: Self-pay | Admitting: Internal Medicine
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
## 2019-03-01 ENCOUNTER — Ambulatory Visit (INDEPENDENT_AMBULATORY_CARE_PROVIDER_SITE_OTHER): Payer: Medicare PPO | Admitting: Cardiovascular Disease

## 2019-03-01 ENCOUNTER — Other Ambulatory Visit: Payer: Self-pay

## 2019-03-01 ENCOUNTER — Encounter: Payer: Self-pay | Admitting: Cardiovascular Disease

## 2019-03-01 VITALS — BP 147/84 | HR 88 | Temp 97.8°F | Wt 157.0 lb

## 2019-03-01 DIAGNOSIS — Z86718 Personal history of other venous thrombosis and embolism: Secondary | ICD-10-CM | POA: Diagnosis not present

## 2019-03-01 DIAGNOSIS — Z8249 Family history of ischemic heart disease and other diseases of the circulatory system: Secondary | ICD-10-CM

## 2019-03-01 DIAGNOSIS — R079 Chest pain, unspecified: Secondary | ICD-10-CM

## 2019-03-01 NOTE — Patient Instructions (Signed)
Medication Instructions:  Your physician recommends that you continue on your current medications as directed. Please refer to the Current Medication list given to you today.   Labwork: NONE  Testing/Procedures: CARDIAC CALCIUM SCORING   Follow-Up: Your physician recommends that you schedule a follow-up appointment in:  AS NEEDED    Any Other Special Instructions Will Be Listed Below (If Applicable).     If you need a refill on your cardiac medications before your next appointment, please call your pharmacy.

## 2019-03-15 ENCOUNTER — Inpatient Hospital Stay: Admission: RE | Admit: 2019-03-15 | Payer: Medicare PPO | Source: Ambulatory Visit

## 2019-03-26 ENCOUNTER — Encounter (HOSPITAL_COMMUNITY): Payer: Self-pay | Admitting: Psychiatry

## 2019-03-26 ENCOUNTER — Ambulatory Visit (INDEPENDENT_AMBULATORY_CARE_PROVIDER_SITE_OTHER): Payer: Medicare PPO | Admitting: Psychiatry

## 2019-03-26 ENCOUNTER — Other Ambulatory Visit: Payer: Self-pay

## 2019-03-26 DIAGNOSIS — F4310 Post-traumatic stress disorder, unspecified: Secondary | ICD-10-CM

## 2019-03-26 MED ORDER — DULOXETINE HCL 60 MG PO CPEP: 60.0000 mg | ORAL_CAPSULE | Freq: Two times a day (BID) | ORAL | 2 refills | Status: DC
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
## 2019-05-06 ENCOUNTER — Encounter (HOSPITAL_COMMUNITY): Payer: Self-pay | Admitting: Psychiatry

## 2019-05-06 ENCOUNTER — Other Ambulatory Visit: Payer: Self-pay

## 2019-05-06 ENCOUNTER — Ambulatory Visit (INDEPENDENT_AMBULATORY_CARE_PROVIDER_SITE_OTHER): Payer: Medicare PPO | Admitting: Psychiatry

## 2019-05-06 DIAGNOSIS — F4310 Post-traumatic stress disorder, unspecified: Secondary | ICD-10-CM
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
## 2019-05-20 ENCOUNTER — Ambulatory Visit (INDEPENDENT_AMBULATORY_CARE_PROVIDER_SITE_OTHER): Payer: Medicare PPO | Admitting: Psychiatry

## 2019-05-20 ENCOUNTER — Other Ambulatory Visit: Payer: Self-pay

## 2019-05-20 DIAGNOSIS — F4310 Post-traumatic stress disorder, unspecified: Secondary | ICD-10-CM
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
## 2019-05-27 ENCOUNTER — Ambulatory Visit (HOSPITAL_COMMUNITY): Payer: Medicare PPO | Admitting: Psychiatry

## 2019-06-03 ENCOUNTER — Ambulatory Visit (INDEPENDENT_AMBULATORY_CARE_PROVIDER_SITE_OTHER): Payer: Medicare PPO | Admitting: Psychiatry

## 2019-06-03 ENCOUNTER — Other Ambulatory Visit: Payer: Self-pay

## 2019-06-03 DIAGNOSIS — F4310 Post-traumatic stress disorder, unspecified: Secondary | ICD-10-CM
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
## 2019-06-04 ENCOUNTER — Other Ambulatory Visit: Payer: Self-pay

## 2019-06-04 ENCOUNTER — Ambulatory Visit (INDEPENDENT_AMBULATORY_CARE_PROVIDER_SITE_OTHER): Payer: Medicare PPO | Admitting: Psychiatry

## 2019-06-04 ENCOUNTER — Encounter (HOSPITAL_COMMUNITY): Payer: Self-pay | Admitting: Psychiatry

## 2019-06-04 DIAGNOSIS — F4310 Post-traumatic stress disorder, unspecified: Secondary | ICD-10-CM | POA: Diagnosis not present

## 2019-06-04 MED ORDER — DULOXETINE HCL 60 MG PO CPEP: 60.0000 mg | ORAL_CAPSULE | Freq: Every day | ORAL | 2 refills | Status: DC
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
## 2019-06-17 ENCOUNTER — Ambulatory Visit (INDEPENDENT_AMBULATORY_CARE_PROVIDER_SITE_OTHER): Payer: Medicare PPO | Admitting: Psychiatry

## 2019-06-17 ENCOUNTER — Other Ambulatory Visit: Payer: Self-pay

## 2019-06-17 DIAGNOSIS — F4310 Post-traumatic stress disorder, unspecified: Secondary | ICD-10-CM | POA: Diagnosis not present
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
## 2019-07-04 ENCOUNTER — Ambulatory Visit (INDEPENDENT_AMBULATORY_CARE_PROVIDER_SITE_OTHER): Payer: Medicare PPO | Admitting: Psychiatry

## 2019-07-04 ENCOUNTER — Other Ambulatory Visit: Payer: Self-pay

## 2019-07-04 DIAGNOSIS — F4310 Post-traumatic stress disorder, unspecified: Secondary | ICD-10-CM
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
## 2019-07-29 ENCOUNTER — Ambulatory Visit (INDEPENDENT_AMBULATORY_CARE_PROVIDER_SITE_OTHER): Payer: Medicare PPO | Admitting: Psychiatry

## 2019-07-29 ENCOUNTER — Other Ambulatory Visit: Payer: Self-pay

## 2019-07-29 DIAGNOSIS — F4310 Post-traumatic stress disorder, unspecified: Secondary | ICD-10-CM
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
## 2019-08-05 ENCOUNTER — Other Ambulatory Visit: Payer: Self-pay

## 2019-08-05 ENCOUNTER — Ambulatory Visit (INDEPENDENT_AMBULATORY_CARE_PROVIDER_SITE_OTHER): Payer: Medicare PPO | Admitting: Psychiatry

## 2019-08-05 ENCOUNTER — Encounter (HOSPITAL_COMMUNITY): Payer: Self-pay | Admitting: Psychiatry

## 2019-08-05 DIAGNOSIS — F4310 Post-traumatic stress disorder, unspecified: Secondary | ICD-10-CM

## 2019-08-05 MED ORDER — DULOXETINE HCL 60 MG PO CPEP: 60.0000 mg | ORAL_CAPSULE | Freq: Every day | ORAL | 2 refills | Status: DC
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
## 2019-11-05 ENCOUNTER — Ambulatory Visit (HOSPITAL_COMMUNITY): Payer: Medicare PPO | Admitting: Psychiatry

## 2019-11-07 ENCOUNTER — Other Ambulatory Visit: Payer: Self-pay

## 2019-11-07 ENCOUNTER — Ambulatory Visit (INDEPENDENT_AMBULATORY_CARE_PROVIDER_SITE_OTHER): Payer: Medicare PPO | Admitting: Psychiatry

## 2019-11-07 ENCOUNTER — Encounter (HOSPITAL_COMMUNITY): Payer: Self-pay | Admitting: Psychiatry

## 2019-11-07 DIAGNOSIS — F4310 Post-traumatic stress disorder, unspecified: Secondary | ICD-10-CM

## 2019-11-07 MED ORDER — DULOXETINE HCL 60 MG PO CPEP: 60.0000 mg | ORAL_CAPSULE | Freq: Every day | ORAL | 2 refills | Status: DC
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
## 2020-02-04 ENCOUNTER — Encounter (HOSPITAL_COMMUNITY): Payer: Self-pay | Admitting: Psychiatry

## 2020-02-04 ENCOUNTER — Other Ambulatory Visit: Payer: Self-pay

## 2020-02-04 ENCOUNTER — Telehealth (INDEPENDENT_AMBULATORY_CARE_PROVIDER_SITE_OTHER): Payer: Medicare PPO | Admitting: Psychiatry

## 2020-02-04 DIAGNOSIS — F4310 Post-traumatic stress disorder, unspecified: Secondary | ICD-10-CM | POA: Diagnosis not present

## 2020-02-04 MED ORDER — DULOXETINE HCL 60 MG PO CPEP: 60.0000 mg | ORAL_CAPSULE | Freq: Every day | ORAL | 2 refills | Status: DC
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
## 2020-03-11 ENCOUNTER — Telehealth (HOSPITAL_COMMUNITY): Payer: Self-pay | Admitting: *Deleted

## 2020-03-11 NOTE — Telephone Encounter (Signed)
Office received fax at 5:34 pm on July 27th from patient stating that she would like provider to complete form and fax it back to [place]'s DMV by July 31 st so that she do not lose her rights to driving.   Staff called patient and was not able to reach her and [facility] informing patient that office need release of information for [place] DMV and informed patient on the voicemail that providers are not working in the office all the time and works remotely and to give providers 7 business days turn around time to be able to complete forms. Office number was provided on voicemail for patient to call office back.

## 2020-07-02 ENCOUNTER — Other Ambulatory Visit (HOSPITAL_COMMUNITY): Payer: Self-pay | Admitting: Nurse Practitioner

## 2020-07-02 ENCOUNTER — Other Ambulatory Visit: Payer: Self-pay | Admitting: Nurse Practitioner

## 2020-07-02 DIAGNOSIS — M545 Low back pain, unspecified: Secondary | ICD-10-CM

## 2020-07-02 DIAGNOSIS — M62830 Muscle spasm of back: Secondary | ICD-10-CM

## 2020-07-02 DIAGNOSIS — G629 Polyneuropathy, unspecified: Secondary | ICD-10-CM

## 2020-11-02 ENCOUNTER — Other Ambulatory Visit (HOSPITAL_COMMUNITY): Payer: Self-pay | Admitting: Nurse Practitioner

## 2020-11-02 DIAGNOSIS — M545 Low back pain, unspecified: Secondary | ICD-10-CM

## 2020-11-02 DIAGNOSIS — G629 Polyneuropathy, unspecified: Secondary | ICD-10-CM

## 2020-11-02 DIAGNOSIS — M62830 Muscle spasm of back: Secondary | ICD-10-CM

## 2020-11-17 ENCOUNTER — Encounter (HOSPITAL_COMMUNITY): Payer: Self-pay | Admitting: Psychiatry

## 2020-11-17 ENCOUNTER — Other Ambulatory Visit: Payer: Self-pay

## 2020-11-17 ENCOUNTER — Telehealth (INDEPENDENT_AMBULATORY_CARE_PROVIDER_SITE_OTHER): Payer: Medicare PPO | Admitting: Psychiatry

## 2020-11-17 DIAGNOSIS — F4310 Post-traumatic stress disorder, unspecified: Secondary | ICD-10-CM | POA: Diagnosis not present

## 2020-11-17 MED ORDER — DULOXETINE HCL 60 MG PO CPEP: 60.0000 mg | ORAL_CAPSULE | Freq: Every day | ORAL | 2 refills | Status: DC

## 2020-11-17 MED ORDER — DULOXETINE HCL 30 MG PO CPEP: 30.0000 mg | ORAL_CAPSULE | Freq: Every day | ORAL | 2 refills | Status: DC
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
## 2020-11-19 ENCOUNTER — Ambulatory Visit (HOSPITAL_COMMUNITY): Payer: Medicare PPO

## 2020-11-26 ENCOUNTER — Encounter (HOSPITAL_COMMUNITY): Payer: Self-pay

## 2020-11-30 ENCOUNTER — Encounter (HOSPITAL_COMMUNITY): Payer: Self-pay

## 2020-11-30 ENCOUNTER — Ambulatory Visit (HOSPITAL_COMMUNITY): Payer: Medicare PPO

## 2020-12-01 ENCOUNTER — Other Ambulatory Visit: Payer: Self-pay

## 2020-12-01 ENCOUNTER — Ambulatory Visit (INDEPENDENT_AMBULATORY_CARE_PROVIDER_SITE_OTHER): Payer: Medicare PPO | Admitting: Psychiatry

## 2020-12-01 DIAGNOSIS — F4310 Post-traumatic stress disorder, unspecified: Secondary | ICD-10-CM | POA: Diagnosis not present
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
## 2021-01-18 ENCOUNTER — Other Ambulatory Visit: Payer: Self-pay

## 2021-01-18 ENCOUNTER — Encounter (HOSPITAL_COMMUNITY): Payer: Self-pay | Admitting: Psychiatry

## 2021-01-18 ENCOUNTER — Telehealth (INDEPENDENT_AMBULATORY_CARE_PROVIDER_SITE_OTHER): Payer: Medicare PPO | Admitting: Psychiatry

## 2021-01-18 DIAGNOSIS — F4310 Post-traumatic stress disorder, unspecified: Secondary | ICD-10-CM

## 2021-01-18 MED ORDER — DULOXETINE HCL 60 MG PO CPEP: 60.0000 mg | ORAL_CAPSULE | Freq: Every day | ORAL | 2 refills | Status: DC

## 2021-01-18 MED ORDER — DULOXETINE HCL 30 MG PO CPEP: 30.0000 mg | ORAL_CAPSULE | Freq: Every day | ORAL | 2 refills | Status: DC
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
## 2021-05-21 ENCOUNTER — Encounter (HOSPITAL_COMMUNITY): Payer: Self-pay | Admitting: Psychiatry

## 2021-05-21 ENCOUNTER — Telehealth (INDEPENDENT_AMBULATORY_CARE_PROVIDER_SITE_OTHER): Payer: Medicare PPO | Admitting: Psychiatry

## 2021-05-21 ENCOUNTER — Other Ambulatory Visit: Payer: Self-pay

## 2021-05-21 DIAGNOSIS — F4310 Post-traumatic stress disorder, unspecified: Secondary | ICD-10-CM | POA: Diagnosis not present

## 2021-05-21 MED ORDER — DULOXETINE HCL 60 MG PO CPEP: 60.0000 mg | ORAL_CAPSULE | Freq: Every day | ORAL | 2 refills | Status: DC
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
## 2021-09-20 ENCOUNTER — Encounter (HOSPITAL_COMMUNITY): Payer: Self-pay | Admitting: Psychiatry

## 2021-09-20 ENCOUNTER — Telehealth (INDEPENDENT_AMBULATORY_CARE_PROVIDER_SITE_OTHER): Payer: Medicare PPO | Admitting: Psychiatry

## 2021-09-20 ENCOUNTER — Other Ambulatory Visit: Payer: Self-pay

## 2021-09-20 DIAGNOSIS — F4310 Post-traumatic stress disorder, unspecified: Secondary | ICD-10-CM | POA: Diagnosis not present

## 2021-09-20 MED ORDER — DULOXETINE HCL 60 MG PO CPEP: 60.0000 mg | ORAL_CAPSULE | Freq: Every day | ORAL | 2 refills | Status: DC
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
## 2022-05-11 DIAGNOSIS — I10 Essential (primary) hypertension: Secondary | ICD-10-CM | POA: Insufficient documentation

## 2022-05-11 DIAGNOSIS — E785 Hyperlipidemia, unspecified: Secondary | ICD-10-CM | POA: Insufficient documentation

## 2022-05-16 DIAGNOSIS — G43909 Migraine, unspecified, not intractable, without status migrainosus: Secondary | ICD-10-CM | POA: Insufficient documentation

## 2022-09-26 ENCOUNTER — Encounter: Payer: Self-pay | Admitting: Internal Medicine

## 2022-09-26 ENCOUNTER — Ambulatory Visit: Payer: Medicare PPO | Admitting: Internal Medicine

## 2022-09-27 ENCOUNTER — Ambulatory Visit: Payer: Medicare PPO | Admitting: Internal Medicine

## 2022-10-04 DIAGNOSIS — N939 Abnormal uterine and vaginal bleeding, unspecified: Secondary | ICD-10-CM | POA: Insufficient documentation

## 2022-10-19 ENCOUNTER — Ambulatory Visit: Payer: Medicare PPO | Admitting: Internal Medicine

## 2022-10-31 ENCOUNTER — Other Ambulatory Visit (HOSPITAL_COMMUNITY): Payer: Self-pay | Admitting: Psychiatry

## 2022-11-29 DIAGNOSIS — R7303 Prediabetes: Secondary | ICD-10-CM | POA: Insufficient documentation

## 2023-06-06 DIAGNOSIS — N951 Menopausal and female climacteric states: Secondary | ICD-10-CM | POA: Insufficient documentation

## 2023-09-23 ENCOUNTER — Other Ambulatory Visit (HOSPITAL_COMMUNITY): Payer: Self-pay | Admitting: Psychiatry

## 2023-09-23 NOTE — Telephone Encounter (Signed)
 Call for appt

## 2023-10-09 ENCOUNTER — Ambulatory Visit (INDEPENDENT_AMBULATORY_CARE_PROVIDER_SITE_OTHER): Payer: Medicare PPO | Admitting: Psychiatry

## 2023-10-09 ENCOUNTER — Encounter (HOSPITAL_COMMUNITY): Payer: Self-pay | Admitting: Psychiatry

## 2023-10-09 VITALS — BP 144/77 | HR 60 | Ht 67.0 in | Wt 150.0 lb

## 2023-10-09 DIAGNOSIS — F4310 Post-traumatic stress disorder, unspecified: Secondary | ICD-10-CM
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
## 2024-04-01 ENCOUNTER — Ambulatory Visit (INDEPENDENT_AMBULATORY_CARE_PROVIDER_SITE_OTHER): Payer: Medicare PPO | Admitting: Psychiatry

## 2024-04-01 ENCOUNTER — Encounter (HOSPITAL_COMMUNITY): Payer: Self-pay | Admitting: Psychiatry

## 2024-04-01 VITALS — BP 149/70 | HR 56 | Temp 98.1°F | Ht 67.0 in | Wt 151.6 lb

## 2024-04-01 DIAGNOSIS — F4310 Post-traumatic stress disorder, unspecified: Secondary | ICD-10-CM

## 2024-04-01 DIAGNOSIS — F32A Depression, unspecified: Secondary | ICD-10-CM | POA: Insufficient documentation

## 2024-04-01 DIAGNOSIS — R609 Edema, unspecified: Secondary | ICD-10-CM | POA: Insufficient documentation

## 2024-04-01 DIAGNOSIS — G629 Polyneuropathy, unspecified: Secondary | ICD-10-CM | POA: Insufficient documentation

## 2024-04-01 DIAGNOSIS — E559 Vitamin D deficiency, unspecified: Secondary | ICD-10-CM | POA: Insufficient documentation

## 2024-04-01 DIAGNOSIS — M545 Low back pain, unspecified: Secondary | ICD-10-CM | POA: Insufficient documentation

## 2024-04-01 DIAGNOSIS — Z8249 Family history of ischemic heart disease and other diseases of the circulatory system: Secondary | ICD-10-CM | POA: Insufficient documentation

## 2024-04-01 DIAGNOSIS — K5909 Other constipation: Secondary | ICD-10-CM | POA: Insufficient documentation

## 2024-04-01 DIAGNOSIS — M6281 Muscle weakness (generalized): Secondary | ICD-10-CM | POA: Insufficient documentation

## 2024-04-01 DIAGNOSIS — Z86718 Personal history of other venous thrombosis and embolism: Secondary | ICD-10-CM | POA: Insufficient documentation

## 2024-04-01 MED ORDER — DULOXETINE HCL 60 MG PO CPEP: 60.0000 mg | ORAL_CAPSULE | Freq: Every day | ORAL | 3 refills | Status: DC
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
## 2024-06-25 ENCOUNTER — Ambulatory Visit (INDEPENDENT_AMBULATORY_CARE_PROVIDER_SITE_OTHER): Admitting: Psychiatry

## 2024-06-25 DIAGNOSIS — F322 Major depressive disorder, single episode, severe without psychotic features: Secondary | ICD-10-CM | POA: Diagnosis not present
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
## 2024-07-02 ENCOUNTER — Encounter (HOSPITAL_COMMUNITY): Payer: Self-pay | Admitting: Psychiatry

## 2024-07-02 ENCOUNTER — Telehealth (INDEPENDENT_AMBULATORY_CARE_PROVIDER_SITE_OTHER): Admitting: Psychiatry

## 2024-07-02 DIAGNOSIS — F322 Major depressive disorder, single episode, severe without psychotic features: Secondary | ICD-10-CM

## 2024-07-02 MED ORDER — DULOXETINE HCL 60 MG PO CPEP: 60.0000 mg | ORAL_CAPSULE | Freq: Every day | ORAL | 3 refills | Status: AC
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
## 2024-09-17 ENCOUNTER — Encounter (HOSPITAL_COMMUNITY): Payer: Self-pay

## 2024-09-17 ENCOUNTER — Ambulatory Visit (HOSPITAL_COMMUNITY): Admitting: Psychiatry

## 2024-09-17 DIAGNOSIS — F322 Major depressive disorder, single episode, severe without psychotic features: Secondary | ICD-10-CM
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
## 2024-10-01 ENCOUNTER — Ambulatory Visit (HOSPITAL_COMMUNITY): Admitting: Psychiatry

## 2024-10-03 ENCOUNTER — Ambulatory Visit (HOSPITAL_COMMUNITY): Admitting: Psychiatry

## 2024-10-15 ENCOUNTER — Ambulatory Visit (HOSPITAL_COMMUNITY): Admitting: Psychiatry

## 2024-11-07 ENCOUNTER — Ambulatory Visit (HOSPITAL_COMMUNITY): Admitting: Psychiatry
